# Patient Record
Sex: Male | Born: 1960 | Race: White | Hispanic: No | Marital: Married | Smoking: Never smoker
Health system: Southern US, Community
[De-identification: ages and names within clinical notes are randomized; demographics above are authoritative.]

## PROBLEM LIST (undated history)

## (undated) DIAGNOSIS — R519 Headache, unspecified: Secondary | ICD-10-CM

## (undated) DIAGNOSIS — R51 Headache: Secondary | ICD-10-CM

## (undated) DIAGNOSIS — M1990 Unspecified osteoarthritis, unspecified site: Secondary | ICD-10-CM

## (undated) HISTORY — DX: Headache: R51

## (undated) HISTORY — PX: EYE SURGERY: SHX253

## (undated) HISTORY — DX: Headache, unspecified: R51.9

## (undated) HISTORY — DX: Unspecified osteoarthritis, unspecified site: M19.90

---

## 2001-12-31 HISTORY — PX: VASECTOMY: SHX75

## 2009-01-14 ENCOUNTER — Ambulatory Visit: Payer: Self-pay | Admitting: Internal Medicine

## 2010-09-20 ENCOUNTER — Ambulatory Visit: Payer: Self-pay | Admitting: Internal Medicine

## 2010-09-20 LAB — CONVERTED CEMR LAB
ALT: 24 units/L (ref 0–53)
Basophils Relative: 0.6 % (ref 0.0–3.0)
Bilirubin, Direct: 0.1 mg/dL (ref 0.0–0.3)
Chloride: 104 meq/L (ref 96–112)
Eosinophils Relative: 2.3 % (ref 0.0–5.0)
Glucose, Urine, Semiquant: NEGATIVE
HCT: 44.3 % (ref 39.0–52.0)
Lymphs Abs: 2.2 10*3/uL (ref 0.7–4.0)
MCV: 90 fL (ref 78.0–100.0)
Monocytes Absolute: 0.4 10*3/uL (ref 0.1–1.0)
PSA: 0.5 ng/mL (ref 0.10–4.00)
Potassium: 5.4 meq/L — ABNORMAL HIGH (ref 3.5–5.1)
Protein, U semiquant: NEGATIVE
RBC: 4.93 M/uL (ref 4.22–5.81)
Total CHOL/HDL Ratio: 6
Total Protein: 6.8 g/dL (ref 6.0–8.3)
Urobilinogen, UA: 0.2
VLDL: 33 mg/dL (ref 0.0–40.0)
WBC Urine, dipstick: NEGATIVE
WBC: 5.4 10*3/uL (ref 4.5–10.5)

## 2010-09-27 ENCOUNTER — Ambulatory Visit: Payer: Self-pay | Admitting: Internal Medicine

## 2011-01-30 NOTE — Assessment & Plan Note (Signed)
Summary: cpx/njr   Vital Signs:  Patient profile:   50 year old male Height:      65 inches Weight:      184 pounds BMI:     30.73 Temp:     98.7 degrees F oral BP sitting:   122 / 78  (left arm) Cuff size:   regular  Vitals Entered By: Duard Brady LPN (September 27, 2010 9:20 AM) CC: cpx- doing well Is Patient Diabetic? No Flu Vaccine Consent Questions     Do you have a history of severe allergic reactions to this vaccine? no    Any prior history of allergic reactions to egg and/or gelatin? no    Do you have a sensitivity to the preservative Thimersol? no    Do you have a past history of Guillan-Barre Syndrome? no    Do you currently have an acute febrile illness? no    Have you ever had a severe reaction to latex? no    Vaccine information given and explained to patient? yes    Are you currently pregnant? no    Lot Number:[id number]   Exp Date:06/30/2011   Site Given  Left Deltoid IM   CC:  cpx- doing well.  History of Present Illness: 50 year old patient who is seen today for a health maintenance examination  Allergies: 1)  ! Penicillin G Potassium (Penicillin G Potassium) 2)  ! Valium  Past History:  Past Medical History: Reviewed history from 01/14/2009 and no changes required. history of UTI 5 years ago  Past Surgical History: Reviewed history from 01/14/2009 and no changes required. Inguinal herniorrhaphy  84 Vasectomy  2000  Family History: Reviewed history from 01/14/2009 and no changes required. father age 30 in good health retired Production designer, theatre/television/film mother, age 47, in good health  One brother  two sister are well  Both grandfathers history of diabetes  Social History: Reviewed history from 01/14/2009 and no changes required. 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  Review of Systems  The patient denies anorexia, fever, weight loss, weight gain, vision loss, decreased hearing, hoarseness, chest pain,  syncope, dyspnea on exertion, peripheral edema, prolonged cough, headaches, hemoptysis, abdominal pain, melena, hematochezia, severe indigestion/heartburn, hematuria, incontinence, genital sores, muscle weakness, suspicious skin lesions, transient blindness, difficulty walking, depression, unusual weight change, abnormal bleeding, enlarged lymph nodes, angioedema, breast masses, and testicular masses.    Physical Exam  General:  mildly overweight.  Blood pressure 120/78 Head:  Normocephalic and atraumatic without obvious abnormalities. No apparent alopecia or balding. Eyes:  No corneal or conjunctival inflammation noted. EOMI. Perrla. Funduscopic exam benign, without hemorrhages, exudates or papilledema. Vision grossly normal. Ears:  External ear exam shows no significant lesions or deformities.  Otoscopic examination reveals clear canals, tympanic membranes are intact bilaterally without bulging, retraction, inflammation or discharge. Hearing is grossly normal bilaterally. Nose:  External nasal examination shows no deformity or inflammation. Nasal mucosa are pink and moist without lesions or exudates. Mouth:  Oral mucosa and oropharynx without lesions or exudates.  Teeth in good repair. Neck:  No deformities, masses, or tenderness noted. Chest Wall:  No deformities, masses, tenderness or gynecomastia noted. Breasts:  No masses or gynecomastia noted Lungs:  Normal respiratory effort, chest expands symmetrically. Lungs are clear to auscultation, no crackles or wheezes. Heart:  Normal rate and regular rhythm. S1 and S2 normal without gallop, murmur, click, rub or other extra sounds. Abdomen:  Bowel sounds positive,abdomen soft and non-tender without masses,  organomegaly or hernias noted. Rectal:  No external abnormalities noted. Normal sphincter tone. No rectal masses or tenderness. Genitalia:  Testes bilaterally descended without nodularity, tenderness or masses. No scrotal masses or lesions. No  penis lesions or urethral discharge. Prostate:  Prostate gland firm and smooth, no enlargement, nodularity, tenderness, mass, asymmetry or induration. Msk:  No deformity or scoliosis noted of thoracic or lumbar spine.   Pulses:  R and L carotid,radial,femoral,dorsalis pedis and posterior tibial pulses are full and equal bilaterally Extremities:  No clubbing, cyanosis, edema, or deformity noted with normal full range of motion of all joints.   Neurologic:  No cranial nerve deficits noted. Station and gait are normal. Plantar reflexes are down-going bilaterally. DTRs are symmetrical throughout. Sensory, motor and coordinative functions appear intact. Skin:  Intact without suspicious lesions or rashes Cervical Nodes:  No lymphadenopathy noted Axillary Nodes:  No palpable lymphadenopathy Inguinal Nodes:  No significant adenopathy Psych:  Cognition and judgment appear intact. Alert and cooperative with normal attention span and concentration. No apparent delusions, illusions, hallucinations   Impression & Recommendations:  Problem # 1:  PREVENTIVE HEALTH CARE (ICD-V70.0)  Orders: Gastroenterology Referral (GI)  Problem # 2:  PREVENTIVE HEALTH CARE (ICD-V70.0)  Orders: Gastroenterology Referral (GI)  Complete Medication List: 1)  No Current Rx Meds   Other Orders: Admin 1st Vaccine (40981) Flu Vaccine 57yrs + ([address])  Patient Instructions: 1)  Please schedule a follow-up appointment in 1 year. 2)  It is important that you exercise regularly at least 20 minutes 5 times a week. If you develop chest pain, have severe difficulty breathing, or feel very tired , stop exercising immediately and seek medical attention. 3)  Schedule a colonoscopy/sigmoidoscopy to help detect colon cancer. 4)  Limit your Sodium (Salt) to less than 2 grams a day(slightly less than 1/2 a teaspoon) to prevent fluid retention, swelling, or worsening of symptoms.

## 2011-01-31 ENCOUNTER — Encounter (INDEPENDENT_AMBULATORY_CARE_PROVIDER_SITE_OTHER): Payer: Self-pay | Admitting: *Deleted

## 2011-02-07 NOTE — Letter (Signed)
Summary: Pre Visit Letter Revised  [facility]  [address] [place], [place] [address]   Phone: [phone]  Fax: [phone]        01/31/2011 MRN: [id number] Jacob Caldwell [address] [place], [place]  [address]             Procedure Date:  03/06/2011 @ 9:30   Direct colon-Dr. Leone Payor   Welcome to the Gastroenterology Division at [facility].    You are scheduled to see a nurse for your pre-procedure visit on 02/21/2011 at 11:00 on the 3rd floor at [facility], [address]  We ask that you try to arrive at our office 15 minutes prior to your appointment time to allow for check-in.  Please take a minute to review the attached form.  If you answer "Yes" to one or more of the questions on the first page, we ask that you call the person listed at your earliest opportunity.  If you answer "No" to all of the questions, please complete the rest of the form and bring it to your appointment.    Your nurse visit will consist of discussing your medical and surgical history, your immediate family medical history, and your medications.   If you are unable to list all of your medications on the form, please bring the medication bottles to your appointment and we will list them.  We will need to be aware of both prescribed and over the counter drugs.  We will need to know exact dosage information as well.    Please be prepared to read and sign documents such as consent forms, a financial agreement, and acknowledgement forms.  If necessary, and with your consent, a friend or relative is welcome to sit-in on the nurse visit with you.  Please bring your insurance card so that we may make a copy of it.  If your insurance requires a referral to see a specialist, please bring your referral form from your primary care physician.  No co-pay is required for this nurse visit.     If you cannot keep your appointment, please call [phone] to cancel or reschedule prior to  your appointment date.  This allows [place] the opportunity to schedule an appointment for another patient in need of care.    Thank you for choosing [facility] for your medical needs.  We appreciate the opportunity to care for you.  Please visit [place] at our website  to learn more about our practice.  Sincerely, The Gastroenterology Division

## 2011-02-20 ENCOUNTER — Encounter (INDEPENDENT_AMBULATORY_CARE_PROVIDER_SITE_OTHER): Payer: Self-pay | Admitting: *Deleted

## 2011-02-21 ENCOUNTER — Encounter: Payer: Self-pay | Admitting: Internal Medicine

## 2011-02-27 NOTE — Letter (Signed)
Summary: [facility] Instructions  [facility]  [address] [place], [place] [address]   Phone: [phone]  Fax: [phone]       Jacob Caldwell    [birth year]    MRN: [id number]        Procedure Day Dorna Bloom:  [name]  03/09/11     Arrival Time: 7:30AM      Procedure Time:  8:00AM     Location of Procedure:                    _ X_  [facility] (4th Floor)                      PREPARATION FOR COLONOSCOPY WITH MOVIPREP   Starting 5 days prior to your procedure 03/04/11 do not eat nuts, seeds, popcorn, corn, beans, peas,  salads, or any raw vegetables.  Do not take any fiber supplements (e.g. Metamucil, Citrucel, and Benefiber).  THE DAY BEFORE YOUR PROCEDURE         DATE: 03/08/11   DAY: THURSDAY  1.  Drink clear liquids the entire day-NO SOLID FOOD  2.  Do not drink anything colored red or purple.  Avoid juices with pulp.  No orange juice.  3.  Drink at least 64 oz. (8 glasses) of fluid/clear liquids during the day to prevent dehydration and help the prep work efficiently.  CLEAR LIQUIDS INCLUDE: Water Jello Ice Popsicles Tea (sugar ok, no milk/cream) Powdered fruit flavored drinks Coffee (sugar ok, no milk/cream) Gatorade Juice: apple, white grape, white cranberry  Lemonade Clear bullion, consomm, broth Carbonated beverages (any kind) Strained chicken noodle soup Hard Candy                             4.  In the morning, mix first dose of MoviPrep solution:    Empty 1 Pouch A and 1 Pouch B into the disposable container    Add lukewarm drinking water to the top line of the container. Mix to dissolve    Refrigerate (mixed solution should be used within 24 hrs)  5.  Begin drinking the prep at 5:00 p.m. The MoviPrep container is divided by 4 marks.   Every 15 minutes drink the solution down to the next mark (approximately 8 oz) until the full liter is complete.   6.  Follow completed prep with 16 oz of clear liquid of your choice (Nothing  red or purple).  Continue to drink clear liquids until bedtime.  7.  Before going to bed, mix second dose of MoviPrep solution:    Empty 1 Pouch A and 1 Pouch B into the disposable container    Add lukewarm drinking water to the top line of the container. Mix to dissolve    Refrigerate  THE DAY OF YOUR PROCEDURE      DATE: 03/09/11   DAY: FRIDAY  Beginning at 3:00AM (5 hours before procedure):         1. Every 15 minutes, drink the solution down to the next mark (approx 8 oz) until the full liter is complete.  2. Follow completed prep with 16 oz. of clear liquid of your choice.    3. You may drink clear liquids until 6:00AM (2 HOURS BEFORE PROCEDURE).   MEDICATION INSTRUCTIONS  Unless otherwise instructed, you should take regular prescription medications with a small sip of water   as early as possible the morning  of your procedure.        OTHER INSTRUCTIONS  You will need a responsible adult at least 51 years of age to accompany you and drive you home.   This person must remain in the waiting room during your procedure.  Wear loose fitting clothing that is easily removed.  Leave jewelry and other valuables at home.  However, you may wish to bring a book to read or  an iPod/MP3 player to listen to music as you wait for your procedure to start.  Remove all body piercing jewelry and leave at home.  Total time from sign-in until discharge is approximately 2-3 hours.  You should go home directly after your procedure and rest.  You can resume normal activities the  day after your procedure.  The day of your procedure you should not:   Drive   Make legal decisions   Operate machinery   Drink alcohol   Return to work  You will receive specific instructions about eating, activities and medications before you leave.    The above instructions have been reviewed and explained to me by   Wyona Almas RN  February 21, 2011 11:40 AM     I fully understand and can  verbalize these instructions _____________________________ Date _________

## 2011-02-27 NOTE — Miscellaneous (Signed)
Summary: [facility] Previsit/prep  Clinical Lists Changes  Medications: Added new medication of MOVIPREP 100 GM  SOLR (PEG-KCL-NACL-NASULF-NA ASC-C) As per prep instructions. - Signed Rx of MOVIPREP 100 GM  SOLR (PEG-KCL-NACL-NASULF-NA ASC-C) As per prep instructions.;  #1 x 0;  Signed;  Entered by: Wyona Almas RN;  Authorized by: Iva Boop MD, [facility];  Method used: Electronically to [facility]*, [address] [place], [place]  [id number], Ph: [phone], Fax: [phone] Allergies: Changed allergy or adverse reaction from PENICILLIN G POTASSIUM (PENICILLIN G POTASSIUM) to PENICILLIN G POTASSIUM (PENICILLIN G POTASSIUM)    Prescriptions: MOVIPREP 100 GM  SOLR (PEG-KCL-NACL-NASULF-NA ASC-C) As per prep instructions.  #1 x 0   Entered by:   Wyona Almas RN   Authorized by:   Iva Boop MD, [facility]   Signed by:   Wyona Almas RN on 02/21/2011   Method used:   Electronically to        [facility]* (retail)       [address]       [place], [place]  [id number]       Ph: [phone]       Fax: [phone]   RxID:   [id number]   Appended Document: [facility] Previsit/prep Correct provider is Yancey Flemings  Appended Document: [facility] Previsit/prep [facility] pharmacy notified of change in provider

## 2011-03-06 ENCOUNTER — Other Ambulatory Visit: Payer: Self-pay | Admitting: Internal Medicine

## 2011-03-09 ENCOUNTER — Encounter (AMBULATORY_SURGERY_CENTER): Payer: BC Managed Care – PPO | Admitting: Internal Medicine

## 2011-03-09 ENCOUNTER — Encounter: Payer: Self-pay | Admitting: Internal Medicine

## 2011-03-09 ENCOUNTER — Other Ambulatory Visit: Payer: Self-pay | Admitting: Internal Medicine

## 2011-03-09 DIAGNOSIS — Z1211 Encounter for screening for malignant neoplasm of colon: Secondary | ICD-10-CM

## 2011-03-09 DIAGNOSIS — D126 Benign neoplasm of colon, unspecified: Secondary | ICD-10-CM

## 2011-03-09 DIAGNOSIS — K5730 Diverticulosis of large intestine without perforation or abscess without bleeding: Secondary | ICD-10-CM

## 2011-03-13 NOTE — Procedures (Addendum)
Summary: Colonoscopy  Patient: [name] Note: All result statuses are Final unless otherwise noted.  Tests: (1) Colonoscopy (COL)   COL Colonoscopy           DONE     [facility]     [address]     [place], [place]  [address]          COLONOSCOPY PROCEDURE REPORT          PATIENT:  [name], [name]  MR#:  [id number]     BIRTHDATE:  1961/12/03, 49 yrs. old  GENDER:  male     ENDOSCOPIST:  Wilhemina Bonito. Eda Keys, MD     REF. BY:  Eleonore Chiquito, M.D.     PROCEDURE DATE:  03/09/2011     PROCEDURE:  Colonoscopy with snare polypectomy x 2     ASA CLASS:  Class I     INDICATIONS:  Routine Risk Screening     MEDICATIONS:   MAC sedation, administered by CRNA, propofol     (Diprivan) 360 mg IV          DESCRIPTION OF PROCEDURE:   After the risks benefits and     alternatives of the procedure were thoroughly explained, informed     consent was obtained.  Digital rectal exam was performed and     revealed no abnormalities.   The LB 180AL [id number] endoscope was     introduced through the anus and advanced to the cecum, which was     identified by both the appendix and ileocecal valve, without     limitations.Time to cecum =3:00 min.  The quality of the prep was     excellent, using MoviPrep.  The instrument was then slowly     withdrawn (time = 9:39 min) as the colon was fully examined.     <<PROCEDUREIMAGES>>          FINDINGS:  Two 3mm polyps were found in the ascending colon and     transverse colon. Polyps were snared without cautery. Retrieval     was successful.   Moderate diverticulosis was found in the left     colon.  Otherwise normal colonoscopy without other polyps, masses,     vascular ectasias, or inflammatory changes.   Retroflexed views in     the rectum revealed no abnormalities.    The scope was then     withdrawn from the patient and the procedure completed.          COMPLICATIONS:  None     ENDOSCOPIC IMPRESSION:     1) Two polyps - removed  2) Moderate diverticulosis in the left colon     3) Otherwise nl colonoscopy          RECOMMENDATIONS:     1) Repeat colonoscopy in 5 years if polyp adenomatous; otherwise     10 years          ______________________________     Wilhemina Bonito. Eda Keys, MD          CC:  Gordy Savers, MD; The Patient          n.     eSIGNED:   Wilhemina Bonito. Eda Keys at 03/09/2011 08:58 AM          Moody Bruins, [phone]  Note: An exclamation mark (!) indicates a result that was not dispersed into the flowsheet. Document Creation Date: 03/09/2011 8:58 AM _______________________________________________________________________  (1) Order result status: Final Collection or observation date-time:  03/09/2011 08:51 Requested date-time:  Receipt date-time:  Reported date-time:  Referring Physician:   Ordering Physician: Fransico Setters [phone]) Specimen Source:  Source: Launa Grill Order Number: [id number] Lab site:   Appended Document: Colonoscopy     Procedures Next Due Date:    Colonoscopy: 02/2016

## 2011-03-17 ENCOUNTER — Encounter: Payer: Self-pay | Admitting: Internal Medicine

## 2011-03-20 NOTE — Letter (Addendum)
Summary: Patient Notice- Polyp Results  [facility]  [address] [place], [place] [address]   Phone: [phone]  Fax: [phone]        March 17, 2011 MRN: [id number]    Jacob Caldwell [address] [place], [place]  [address]    Dear [name],  I am pleased to inform you that the colon polyp(s) removed during your recent colonoscopy was (were) found to be benign (no cancer detected) upon pathologic examination.  I recommend you have a repeat colonoscopy examination in 5 years to look for recurrent polyps, as having colon polyps increases your risk for having recurrent polyps or even colon cancer in the future.  Should you develop new or worsening symptoms of abdominal pain, bowel habit changes or bleeding from the rectum or bowels, please schedule an evaluation with either your primary care physician or with me.  Additional information/recommendations:  __ No further action with gastroenterology is needed at this time. Please      follow-up with your primary care physician for your other healthcare      needs.   Please call us if you are having persistent problems or have questions about your condition that have not been fully answered at this time.  Sincerely,  Hilarie Fredrickson MD  This letter has been electronically signed by your physician.  Appended Document: Patient Notice- Polyp Results letter mailed

## 2016-01-13 ENCOUNTER — Ambulatory Visit (INDEPENDENT_AMBULATORY_CARE_PROVIDER_SITE_OTHER): Payer: BC Managed Care – PPO

## 2016-01-13 ENCOUNTER — Ambulatory Visit: Payer: BC Managed Care – PPO | Admitting: Family Medicine

## 2016-01-13 ENCOUNTER — Ambulatory Visit (INDEPENDENT_AMBULATORY_CARE_PROVIDER_SITE_OTHER): Payer: BC Managed Care – PPO | Admitting: Physician Assistant

## 2016-01-13 VITALS — BP 114/80 | HR 70 | Temp 98.9°F | Resp 18 | Ht 66.0 in | Wt 182.4 lb

## 2016-01-13 DIAGNOSIS — J189 Pneumonia, unspecified organism: Secondary | ICD-10-CM

## 2016-01-13 DIAGNOSIS — R509 Fever, unspecified: Secondary | ICD-10-CM | POA: Diagnosis not present

## 2016-01-13 DIAGNOSIS — R059 Cough, unspecified: Secondary | ICD-10-CM

## 2016-01-13 DIAGNOSIS — R05 Cough: Secondary | ICD-10-CM | POA: Diagnosis not present

## 2016-01-13 DIAGNOSIS — J181 Lobar pneumonia, unspecified organism: Secondary | ICD-10-CM

## 2016-01-13 LAB — POCT CBC
Granulocyte percent: 78.1 %G (ref 37–80)
HCT, POC: 39.2 % — AB (ref 43.5–53.7)
HEMOGLOBIN: 13.2 g/dL — AB (ref 14.1–18.1)
LYMPH, POC: 2.5 (ref 0.6–3.4)
MCH, POC: 28.3 pg (ref 27–31.2)
MCHC: 33.7 g/dL (ref 31.8–35.4)
MCV: 83.9 fL (ref 80–97)
MID (cbc): 0.5 (ref 0–0.9)
MPV: 5.8 fL (ref 0–99.8)
PLATELET COUNT, POC: 544 10*3/uL — AB (ref 142–424)
POC GRANULOCYTE: 10.7 — AB (ref 2–6.9)
POC LYMPH %: 18.5 % (ref 10–50)
POC MID %: 3.4 %M (ref 0–12)
RBC: 4.68 M/uL — AB (ref 4.69–6.13)
RDW, POC: 13.3 %
WBC: 13.7 10*3/uL — AB (ref 4.6–10.2)

## 2016-01-13 MED ORDER — LEVOFLOXACIN 500 MG PO TABS
500.0000 mg | ORAL_TABLET | Freq: Every day | ORAL | Status: DC
Start: 2016-01-13 — End: 2016-03-25

## 2016-01-13 MED ORDER — GUAIFENESIN ER 1200 MG PO TB12: 1.0000 | ORAL_TABLET | Freq: Two times a day (BID) | ORAL | Status: AC

## 2016-01-13 MED ORDER — HYDROCOD POLST-CPM POLST ER 10-8 MG/5ML PO SUER: 5.0000 mL | Freq: Two times a day (BID) | ORAL | Status: DC | PRN

## 2016-01-13 NOTE — Progress Notes (Signed)
Jacob Caldwell  MRN: [id number] DOB: 1961-05-23  Subjective:  Pt presents to clinic with concerns about a 3 week fever and then over the last week he has developed a cough that is productive more at night without SOB or wheezing. He is having no nasal congestion and no PND.  No abd or urinary symptoms.  He slightly SOB with activity and has decrease energy and appetite over the last week or so.  Home treatment - motrin for fever and nyquil for cough - does not seem to be helping that much  2 weeks ago seen at the [facility] - neg flu was told viral and to wait it out  There are no active problems to display for this patient.   No current outpatient prescriptions on file prior to visit.   No current facility-administered medications on file prior to visit.    Allergies  Allergen Reactions  . Diazepam     REACTION: hyperactive  . Penicillins     REACTION: hives    Review of Systems  Constitutional: Positive for fever (100-101 in the am - highest 102), chills and appetite change (decreased).  HENT: Negative for congestion, postnasal drip and rhinorrhea.   Respiratory: Positive for cough (clear with minimal yellow). Negative for shortness of breath and wheezing.        No h/o asthma or lung problems, nonsmoker  Gastrointestinal: Negative.   Psychiatric/Behavioral: Positive for sleep disturbance (cough).   Objective:  BP 114/80 mmHg  Pulse 70  Temp(Src) 98.9 F (37.2 C) (Oral)  Resp 18  Ht 5\' 6"  (1.676 m)  Wt 182 lb 6.4 oz (82.736 kg)  BMI 29.45 kg/m2  SpO2 97%  Physical Exam  Constitutional: He is oriented to person, place, and time and well-developed, well-nourished, and in no distress.     HENT:  Head: Normocephalic and atraumatic.  Right Ear: Hearing, tympanic membrane, external ear and ear canal normal.  Left Ear: Hearing, tympanic membrane, external ear and ear canal normal.  Nose: Nose normal.  Mouth/Throat: Uvula is midline, oropharynx is clear and  moist and mucous membranes are normal.  Eyes: Conjunctivae are normal.  Neck: Normal range of motion.  Cardiovascular: Normal rate, regular rhythm and normal heart sounds.   Pulmonary/Chest: Effort normal. He has wheezes (end expiratory worse on the left and worse at bases - slights clears after cough).  Lymphadenopathy:       Head (right side): No tonsillar adenopathy present.       Head (left side): No tonsillar adenopathy present.    He has no cervical adenopathy.       Right: No supraclavicular adenopathy present.       Left: No supraclavicular adenopathy present.  Neurological: He is alert and oriented to person, place, and time. Gait normal.  Skin: Skin is warm and dry.  Psychiatric: Mood, memory, affect and judgment normal.   [facility] reading (PRIMARY) by  Dr. Lorelei Pont.  RML PNA  Results for orders placed or performed in visit on 01/13/16  POCT CBC  Result Value Ref Range   WBC 13.7 (A) 4.6 - 10.2 K/uL   Lymph, poc 2.5 0.6 - 3.4   POC LYMPH PERCENT 18.5 10 - 50 %L   MID (cbc) 0.5 0 - 0.9   POC MID % 3.4 0 - 12 %M   POC Granulocyte 10.7 (A) 2 - 6.9   Granulocyte percent 78.1 37 - 80 %G   RBC 4.68 (A) 4.69 - 6.13 M/uL  Hemoglobin 13.2 (A) 14.1 - 18.1 g/dL   HCT, POC 39.2 (A) 43.5 - 53.7 %   MCV 83.9 80 - 97 fL   MCH, POC 28.3 27 - 31.2 pg   MCHC 33.7 31.8 - 35.4 g/dL   RDW, POC 13.3 %   Platelet Count, POC 544 (A) 142 - 424 K/uL   MPV 5.8 0 - 99.8 fL    Assessment and Plan :  Cough - Plan: DG Chest 2 View, Care order/instruction  Fever, unspecified - Plan: POCT CBC  RML pneumonia - Plan: Guaifenesin (MUCINEX MAXIMUM STRENGTH) 1200 MG TB12, chlorpheniramine-HYDROcodone (TUSSIONEX PENNKINETIC ER) 10-8 MG/5ML SUER, levofloxacin (LEVAQUIN) 500 MG tablet   Pt has community acquired PNA and we will treat accordingly.  He will hydrate and recheck if he has further problems.  D/w Dr Lorelei Pont  Windell Hummingbird PA-C  [facility] 01/14/2016 9:53 AM

## 2016-01-15 ENCOUNTER — Encounter: Payer: Self-pay | Admitting: Family Medicine

## 2016-03-16 ENCOUNTER — Encounter: Payer: Self-pay | Admitting: Internal Medicine

## 2016-03-25 ENCOUNTER — Ambulatory Visit (INDEPENDENT_AMBULATORY_CARE_PROVIDER_SITE_OTHER): Payer: BC Managed Care – PPO | Admitting: Physician Assistant

## 2016-03-25 DIAGNOSIS — J069 Acute upper respiratory infection, unspecified: Secondary | ICD-10-CM

## 2016-03-25 DIAGNOSIS — B9789 Other viral agents as the cause of diseases classified elsewhere: Principal | ICD-10-CM

## 2016-03-25 MED ORDER — HYDROCOD POLST-CPM POLST ER 10-8 MG/5ML PO SUER: 5.0000 mL | Freq: Two times a day (BID) | ORAL | Status: DC | PRN

## 2016-03-25 NOTE — Patient Instructions (Signed)
     IF you received an x-ray today, you will receive an invoice from [facility]. Please contact [facility] at [phone] with questions or concerns regarding your invoice.   IF you received labwork today, you will receive an invoice from [facility]. Please contact [facility] at [phone] with questions or concerns regarding your invoice.   Our billing staff will not be able to assist you with questions regarding bills from these companies.  You will be contacted with the lab results as soon as they are available. The fastest way to get your results is to activate your My Chart account. Instructions are located on the last page of this paperwork. If you have not heard from us regarding the results in 2 weeks, please contact this office.      

## 2016-03-25 NOTE — Progress Notes (Signed)
   Jacob Caldwell  MRN: [id number] DOB: [birth year]  Subjective:  Pt presents to clinic with low grade fever and productive cough for the last 5 days.  He has no nasal symptoms.  He is coughing up clear sputum.  He feels like he did 2 months ago when he was treated for pneumonia and he wants to makes sure he does not have this at this time.  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   No SOB or wheezing.  Home treatment - Advil and mucinex  There are no active problems to display for this patient.   No current outpatient prescriptions on file prior to visit.   No current facility-administered medications on file prior to visit.    Allergies  Allergen Reactions  . Diazepam     REACTION: hyperactive  . Penicillins     REACTION: hives    Review of Systems  Constitutional: Positive for fever (subjective). Negative for chills.  HENT: Negative for congestion.   Respiratory: Positive for cough. Negative for shortness of breath and wheezing.        No h/o asthma, nonsmoker  Musculoskeletal: Negative for myalgias.  Neurological: Negative for headaches.   Objective:  BP 118/64 mmHg  Pulse 84  Temp(Src) 99.6 F (37.6 C) (Oral)  Resp 18  Ht 5\' 6"  (1.676 m)  Wt 182 lb (82.555 kg)  BMI 29.39 kg/m2  SpO2 98%  Physical Exam  Constitutional: He is oriented to person, place, and time and well-developed, well-nourished, and in no distress.  HENT:  Head: Normocephalic and atraumatic.  Right Ear: Hearing, tympanic membrane, external ear and ear canal normal.  Left Ear: Hearing, tympanic membrane, external ear and ear canal normal.  Nose: Nose normal.  Mouth/Throat: Uvula is midline, oropharynx is clear and moist and mucous membranes are normal.  Eyes: Conjunctivae are normal.  Neck: Normal range of motion.  Cardiovascular: Normal rate, regular rhythm and normal heart sounds.   Pulmonary/Chest: Effort normal and breath sounds normal. He has no wheezes (no wheezing  with forced expiration).  Deep coughs from his chest  Lymphadenopathy:       Head (right side): No tonsillar adenopathy present.       Head (left side): No tonsillar adenopathy present.    He has no cervical adenopathy.       Right: No supraclavicular adenopathy present.       Left: No supraclavicular adenopathy present.  Neurological: He is alert and oriented to person, place, and time. Gait normal.  Skin: Skin is warm and dry.  Psychiatric: Mood, memory, affect and judgment normal.    Assessment and Plan :  Viral URI with cough - Plan: chlorpheniramine-HYDROcodone (TUSSIONEX PENNKINETIC ER) 10-8 MG/5ML SUER, Care order/instruction   At this time lungs are clear and we will treat symptoms.  He will continue Mucinex at home and add cough medications as needed.  He will stay hydrated.  He will contact me if he is having problems in about 4-5 days to determine if an abx if needed at that time.  Windell Hummingbird PA-C  [facility] 03/25/2016 9:35 AM

## 2016-03-29 ENCOUNTER — Telehealth: Payer: Self-pay | Admitting: Internal Medicine

## 2016-03-29 NOTE — Telephone Encounter (Signed)
Pt would like to re-est with dr Raliegh Ip. Can I sch?

## 2016-03-29 NOTE — Telephone Encounter (Signed)
Okay to schedule

## 2016-03-30 NOTE — Telephone Encounter (Signed)
Okay 

## 2016-04-03 NOTE — Telephone Encounter (Signed)
Pt will callback once he has his calendar

## 2016-07-10 ENCOUNTER — Ambulatory Visit (INDEPENDENT_AMBULATORY_CARE_PROVIDER_SITE_OTHER): Payer: BC Managed Care – PPO | Admitting: Internal Medicine

## 2016-07-10 ENCOUNTER — Encounter: Payer: Self-pay | Admitting: Internal Medicine

## 2016-07-10 VITALS — BP 150/90 | HR 67 | Temp 98.5°F | Resp 20 | Ht 64.5 in | Wt 180.0 lb

## 2016-07-10 DIAGNOSIS — Z86010 Personal history of colonic polyps: Secondary | ICD-10-CM | POA: Diagnosis not present

## 2016-07-10 DIAGNOSIS — Z0000 Encounter for general adult medical examination without abnormal findings: Secondary | ICD-10-CM

## 2016-07-10 DIAGNOSIS — Z23 Encounter for immunization: Secondary | ICD-10-CM

## 2016-07-10 DIAGNOSIS — R7989 Other specified abnormal findings of blood chemistry: Secondary | ICD-10-CM | POA: Diagnosis not present

## 2016-07-10 LAB — POCT URINALYSIS DIPSTICK
Bilirubin, UA: NEGATIVE
Glucose, UA: NEGATIVE
Ketones, UA: NEGATIVE
Leukocytes, UA: NEGATIVE
NITRITE UA: NEGATIVE
PROTEIN UA: NEGATIVE
RBC UA: NEGATIVE
Urobilinogen, UA: 0.2
pH, UA: 6

## 2016-07-10 NOTE — Progress Notes (Signed)
   Subjective:    Patient ID: Jacob Caldwell, male    DOB: Mar 03, 1961, 55 y.o.   MRN: [id number]  HPI    Review of Systems     Objective:   Physical Exam        Assessment & Plan:

## 2016-07-10 NOTE — Progress Notes (Signed)
Subjective:    Patient ID: Jacob Caldwell, male    DOB: [birth year], 55 y.o.   MRN: [id number]  HPI  History of Present Illness: 55 -year-old gentleman seen today to establish  with our practice AND  for an annual exam. He has enjoyed excellent health without concerns or complaints. He exercises regularly   Current Allergies: ! 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   Risk Factors: Regular exercises.  Plays tennis 3-4 times per week and goes to the local gym at least once weekly.  Walks throughout the week.  Also with his wife  Tobacco use: never    Review of Systems  Constitutional: Negative for fever, chills, activity change, appetite change and fatigue.  HENT: Negative for congestion, dental problem, ear pain, hearing loss, mouth sores, rhinorrhea, sinus pressure, sneezing, tinnitus, trouble swallowing and voice change.   Eyes: Negative for photophobia, pain, redness and visual disturbance.  Respiratory: Negative for apnea, cough, choking, chest tightness, shortness of breath and wheezing.   Cardiovascular: Negative for chest pain, palpitations and leg swelling.  Gastrointestinal: Negative for nausea, vomiting, abdominal pain, diarrhea, constipation, blood in stool, abdominal distention, anal bleeding and rectal pain.  Genitourinary: Negative for dysuria, urgency, frequency, hematuria, flank  pain, decreased urine volume, discharge, penile swelling, scrotal swelling, difficulty urinating, genital sores and testicular pain.  Musculoskeletal: Negative for myalgias, back pain, joint swelling, arthralgias, gait problem, neck pain and neck stiffness.  Skin: Negative for color change, rash and wound.  Neurological: Negative for dizziness, tremors, seizures, syncope, facial asymmetry, speech difficulty, weakness, light-headedness, numbness and headaches.  Hematological: Negative for adenopathy. Does not bruise/bleed easily.  Psychiatric/Behavioral: Negative for suicidal ideas, hallucinations, behavioral problems, confusion, sleep disturbance, self-injury, dysphoric mood, decreased concentration and agitation. The patient is not nervous/anxious.        Objective:   Physical Exam  Constitutional: He appears well-developed and well-nourished.  HENT:  Head: Normocephalic and atraumatic.  Right Ear: External ear normal.  Left Ear: External ear normal.  Nose: Nose normal.  Mouth/Throat: Oropharynx is clear and moist.  Eyes: Conjunctivae and EOM are normal. Pupils are equal, round, and reactive to light. No scleral icterus.  Neck: Normal range of motion. Neck supple. No JVD present. No thyromegaly present.  Cardiovascular: Regular rhythm, normal heart sounds and intact distal pulses.  Exam reveals no gallop and no friction rub.   No murmur heard. Pulmonary/Chest: Effort normal and breath sounds normal. He exhibits no tenderness.  Abdominal: Soft. Bowel sounds are normal. He exhibits no distension and no mass. There is no tenderness.  Genitourinary: Prostate normal and penis normal. Guaiac negative stool.  Musculoskeletal: Normal range of motion. He exhibits no edema or tenderness.  Lymphadenopathy:    He has no cervical adenopathy.  Neurological: He is alert. He has normal reflexes. No cranial nerve deficit. Coordination normal.  Skin: Skin is warm and dry. No rash noted.  Psychiatric: He  has a normal mood  and affect. His behavior is normal.          Assessment & Plan:   Preventive health care History of colonic polyps.  Patient has received a recall letter for follow-up colonoscopy, which she will obtain in the fall Blood pressure today is borderline high.  He was asked to monitor blood pressure readings more carefully.  Otherwise, return in one year for follow-up Will review.  Screening lab  Nyoka Cowden, MD

## 2016-07-10 NOTE — Progress Notes (Signed)
Pre visit review using our clinic review tool, if applicable. No additional management support is needed unless otherwise documented below in the visit note. 

## 2016-07-10 NOTE — Patient Instructions (Addendum)
Schedule your colonoscopy to help detect colon cancer.    It is important that you exercise regularly, at least 20 minutes 3 to 4 times per week.  If you develop chest pain or shortness of breath seek  medical attention.    Health Maintenance, Male A healthy lifestyle and preventative care can promote health and wellness.  Maintain regular health, dental, and eye exams.  Eat a healthy diet. Foods like vegetables, fruits, whole grains, low-fat dairy products, and lean protein foods contain the nutrients you need and are low in calories. Decrease your intake of foods high in solid fats, added sugars, and salt. Get information about a proper diet from your health care provider, if necessary.  Regular physical exercise is one of the most important things you can do for your health. Most adults should get at least 150 minutes of moderate-intensity exercise (any activity that increases your heart rate and causes you to sweat) each week. In addition, most adults need muscle-strengthening exercises on 2 or more days a week.   Maintain a healthy weight. The body mass index (BMI) is a screening tool to identify possible weight problems. It provides an estimate of body fat based on height and weight. Your health care provider can find your BMI and can help you achieve or maintain a healthy weight. For males 20 years and older:  A BMI below 18.5 is considered underweight.  A BMI of 18.5 to 24.9 is normal.  A BMI of 25 to 29.9 is considered overweight.  A BMI of 30 and above is considered obese.  Maintain normal blood lipids and cholesterol by exercising and minimizing your intake of saturated fat. Eat a balanced diet with plenty of fruits and vegetables. Blood tests for lipids and cholesterol should begin at age 64 and be repeated every 5 years. If your lipid or cholesterol levels are high, you are over age 71, or you are at high risk for heart disease, you may need your cholesterol levels checked more  frequently.Ongoing high lipid and cholesterol levels should be treated with medicines if diet and exercise are not working.  If you smoke, find out from your health care provider how to quit. If you do not use tobacco, do not start.  Lung cancer screening is recommended for adults aged 38-80 years who are at high risk for developing lung cancer because of a history of smoking. A yearly low-dose CT scan of the lungs is recommended for people who have at least a 30-pack-year history of smoking and are current smokers or have quit within the past 15 years. A pack year of smoking is smoking an average of 1 pack of cigarettes a day for 1 year (for example, a 30-pack-year history of smoking could mean smoking 1 pack a day for 30 years or 2 packs a day for 15 years). Yearly screening should continue until the smoker has stopped smoking for at least 15 years. Yearly screening should be stopped for people who develop a health problem that would prevent them from having lung cancer treatment.  If you choose to drink alcohol, do not have more than 2 drinks per day. One drink is considered to be 12 oz (360 mL) of beer, 5 oz (150 mL) of wine, or 1.5 oz (45 mL) of liquor.  Avoid the use of street drugs. Do not share needles with anyone. Ask for help if you need support or instructions about stopping the use of drugs.  High blood pressure causes heart disease  and increases the risk of stroke. High blood pressure is more likely to develop in:  People who have blood pressure in the end of the normal range (100-139/85-89 mm Hg).  People who are overweight or obese.  People who are African American.  If you are 58-49 years of age, have your blood pressure checked every 3-5 years. If you are 7 years of age or older, have your blood pressure checked every year. You should have your blood pressure measured twice--once when you are at a hospital or clinic, and once when you are not at a hospital or clinic. Record the  average of the two measurements. To check your blood pressure when you are not at a hospital or clinic, you can use:  An automated blood pressure machine at a pharmacy.  A home blood pressure monitor.  If you are 110-98 years old, ask your health care provider if you should take aspirin to prevent heart disease.  Diabetes screening involves taking a blood sample to check your fasting blood sugar level. This should be done once every 3 years after age 24 if you are at a normal weight and without risk factors for diabetes. Testing should be considered at a younger age or be carried out more frequently if you are overweight and have at least 1 risk factor for diabetes.  Colorectal cancer can be detected and often prevented. Most routine colorectal cancer screening begins at the age of 109 and continues through age 92. However, your health care provider may recommend screening at an earlier age if you have risk factors for colon cancer. On a yearly basis, your health care provider may provide home test kits to check for hidden blood in the stool. A small camera at the end of a tube may be used to directly examine the colon (sigmoidoscopy or colonoscopy) to detect the earliest forms of colorectal cancer. Talk to your health care provider about this at age 41 when routine screening begins. A direct exam of the colon should be repeated every 5-10 years through age 31, unless early forms of precancerous polyps or small growths are found.  People who are at an increased risk for hepatitis B should be screened for this virus. You are considered at high risk for hepatitis B if:  You were born in a country where hepatitis B occurs often. Talk with your health care provider about which countries are considered high risk.  Your parents were born in a high-risk country and you have not received a shot to protect against hepatitis B (hepatitis B vaccine).  You have HIV or AIDS.  You use needles to inject street  drugs.  You live with, or have sex with, someone who has hepatitis B.  You are a man who has sex with other men (MSM).  You get hemodialysis treatment.  You take certain medicines for conditions like cancer, organ transplantation, and autoimmune conditions.  Hepatitis C blood testing is recommended for all people born from [birth year] through 1967 and any individual with known risk factors for hepatitis C.  Healthy men should no longer receive prostate-specific antigen (PSA) blood tests as part of routine cancer screening. Talk to your health care provider about prostate cancer screening.  Testicular cancer screening is not recommended for adolescents or adult males who have no symptoms. Screening includes self-exam, a health care provider exam, and other screening tests. Consult with your health care provider about any symptoms you have or any concerns you have about testicular cancer.  Practice safe sex. Use condoms and avoid high-risk sexual practices to reduce the spread of sexually transmitted infections (STIs).  You should be screened for STIs, including gonorrhea and chlamydia if:  You are sexually active and are younger than 24 years.  You are older than 24 years, and your health care provider tells you that you are at risk for this type of infection.  Your sexual activity has changed since you were last screened, and you are at an increased risk for chlamydia or gonorrhea. Ask your health care provider if you are at risk.  If you are at risk of being infected with HIV, it is recommended that you take a prescription medicine daily to prevent HIV infection. This is called pre-exposure prophylaxis (PrEP). You are considered at risk if:  You are a man who has sex with other men (MSM).  You are a heterosexual man who is sexually active with multiple partners.  You take drugs by injection.  You are sexually active with a partner who has HIV.  Talk with your health care provider about  whether you are at high risk of being infected with HIV. If you choose to begin PrEP, you should first be tested for HIV. You should then be tested every 3 months for as long as you are taking PrEP.  Use sunscreen. Apply sunscreen liberally and repeatedly throughout the day. You should seek shade when your shadow is shorter than you. Protect yourself by wearing long sleeves, pants, a wide-brimmed hat, and sunglasses year round whenever you are outdoors.  Tell your health care provider of new moles or changes in moles, especially if there is a change in shape or color. Also, tell your health care provider if a mole is larger than the size of a pencil eraser.  A one-time screening for abdominal aortic aneurysm (AAA) and surgical repair of large AAAs by ultrasound is recommended for men aged 13-75 years who are current or former smokers.  Stay current with your vaccines (immunizations).   This information is not intended to replace advice given to you by your health care provider. Make sure you discuss any questions you have with your health care provider.   Document Released: 06/14/2008 Document Revised: 01/07/2015 Document Reviewed: 05/14/2011 Elsevier Interactive Patient Education [employer].

## 2016-07-11 LAB — CBC WITH DIFFERENTIAL/PLATELET
BASOS ABS: 0 10*3/uL (ref 0.0–0.1)
Basophils Relative: 0.5 % (ref 0.0–3.0)
EOS PCT: 3.1 % (ref 0.0–5.0)
Eosinophils Absolute: 0.2 10*3/uL (ref 0.0–0.7)
HCT: 42.5 % (ref 39.0–52.0)
HEMOGLOBIN: 14.5 g/dL (ref 13.0–17.0)
Lymphocytes Relative: 35.6 % (ref 12.0–46.0)
Lymphs Abs: 2.5 10*3/uL (ref 0.7–4.0)
MCHC: 34.1 g/dL (ref 30.0–36.0)
MCV: 85.8 fl (ref 78.0–100.0)
MONO ABS: 0.6 10*3/uL (ref 0.1–1.0)
Monocytes Relative: 9.1 % (ref 3.0–12.0)
Neutro Abs: 3.6 10*3/uL (ref 1.4–7.7)
Neutrophils Relative %: 51.7 % (ref 43.0–77.0)
Platelets: 247 10*3/uL (ref 150.0–400.0)
RBC: 4.96 Mil/uL (ref 4.22–5.81)
RDW: 13.7 % (ref 11.5–15.5)
WBC: 7 10*3/uL (ref 4.0–10.5)

## 2016-07-11 LAB — COMPREHENSIVE METABOLIC PANEL
ALBUMIN: 4.5 g/dL (ref 3.5–5.2)
ALK PHOS: 57 U/L (ref 39–117)
ALT: 12 U/L (ref 0–53)
AST: 15 U/L (ref 0–37)
BUN: 20 mg/dL (ref 6–23)
CO2: 29 mEq/L (ref 19–32)
Calcium: 10.2 mg/dL (ref 8.4–10.5)
Chloride: 103 mEq/L (ref 96–112)
Creatinine, Ser: 1.08 mg/dL (ref 0.40–1.50)
GFR: 74.93 mL/min (ref >60.00)
Glucose, Bld: 98 mg/dL (ref 70–99)
POTASSIUM: 4.5 meq/L (ref 3.5–5.1)
SODIUM: 140 meq/L (ref 135–145)
TOTAL PROTEIN: 7.3 g/dL (ref 6.0–8.3)
Total Bilirubin: 0.4 mg/dL (ref 0.2–1.2)

## 2016-07-11 LAB — TSH: TSH: 2.4 u[IU]/mL (ref 0.35–4.50)

## 2016-07-11 LAB — LIPID PANEL
Cholesterol: 222 mg/dL — ABNORMAL HIGH (ref 0–200)
HDL: 37.9 mg/dL — AB (ref >39.00)
NonHDL: 184.29
TRIGLYCERIDES: 218 mg/dL — AB (ref 0.0–149.0)
Total CHOL/HDL Ratio: 6
VLDL: 43.6 mg/dL — ABNORMAL HIGH (ref 0.0–40.0)

## 2016-07-11 LAB — LDL CHOLESTEROL, DIRECT: LDL DIRECT: 157 mg/dL

## 2016-07-11 LAB — PSA: PSA: 1.39 ng/mL (ref 0.10–4.00)

## 2016-07-27 ENCOUNTER — Encounter: Payer: Self-pay | Admitting: Internal Medicine

## 2016-09-04 ENCOUNTER — Ambulatory Visit (AMBULATORY_SURGERY_CENTER): Payer: Self-pay

## 2016-09-04 VITALS — Ht 65.0 in | Wt 183.8 lb

## 2016-09-04 DIAGNOSIS — Z860100 Personal history of colon polyps, unspecified: Secondary | ICD-10-CM

## 2016-09-04 MED ORDER — SUPREP BOWEL PREP KIT 17.5-3.13-1.6 GM/177ML PO SOLN: 1.0000 | Freq: Once | ORAL | 0 refills | Status: AC

## 2016-09-05 ENCOUNTER — Encounter: Payer: Self-pay | Admitting: Internal Medicine

## 2016-09-17 ENCOUNTER — Encounter: Payer: Self-pay | Admitting: Internal Medicine

## 2016-09-17 ENCOUNTER — Ambulatory Visit (AMBULATORY_SURGERY_CENTER): Payer: BC Managed Care – PPO | Admitting: Internal Medicine

## 2016-09-17 VITALS — BP 116/67 | HR 58 | Temp 97.8°F | Resp 15 | Ht 65.0 in | Wt 183.0 lb

## 2016-09-17 DIAGNOSIS — Z86010 Personal history of colonic polyps: Secondary | ICD-10-CM | POA: Diagnosis not present

## 2016-09-17 MED ORDER — SODIUM CHLORIDE 0.9 % IV SOLN: 500.0000 mL | INTRAVENOUS | Status: DC

## 2016-09-17 NOTE — Progress Notes (Signed)
Report to PACU, RN, vss, BBS= Clear.  

## 2016-09-17 NOTE — Patient Instructions (Signed)
YOU HAD AN ENDOSCOPIC PROCEDURE TODAY AT [facility]:   Refer to the procedure report that was given to you for any specific questions about what was found during the examination.  If the procedure report does not answer your questions, please call your gastroenterologist to clarify.  If you requested that your care partner not be given the details of your procedure findings, then the procedure report has been included in a sealed envelope for you to review at your convenience later.  YOU SHOULD EXPECT: Some feelings of bloating in the abdomen. Passage of more gas than usual.  Walking can help get rid of the air that was put into your GI tract during the procedure and reduce the bloating. If you had a lower endoscopy (such as a colonoscopy or flexible sigmoidoscopy) you may notice spotting of blood in your stool or on the toilet paper. If you underwent a bowel prep for your procedure, you may not have a normal bowel movement for a few days.  Please Note:  You might notice some irritation and congestion in your nose or some drainage.  This is from the oxygen used during your procedure.  There is no need for concern and it should clear up in a day or so.  SYMPTOMS TO REPORT IMMEDIATELY:   Following lower endoscopy (colonoscopy or flexible sigmoidoscopy):  Excessive amounts of blood in the stool  Significant tenderness or worsening of abdominal pains  Swelling of the abdomen that is new, acute  Fever of 100F or higher   For urgent or emergent issues, a gastroenterologist can be reached at any hour by calling [phone].   DIET:  We do recommend a small meal at first, but then you may proceed to your regular diet.  Drink plenty of fluids but you should avoid alcoholic beverages for 24 hours.  ACTIVITY:  You should plan to take it easy for the rest of today and you should NOT DRIVE or use heavy machinery until tomorrow (because of the sedation medicines used during the test).     FOLLOW UP: Our staff will call the number listed on your records the next business day following your procedure to check on you and address any questions or concerns that you may have regarding the information given to you following your procedure. If we do not reach you, we will leave a message.  However, if you are feeling well and you are not experiencing any problems, there is no need to return our call.  We will assume that you have returned to your regular daily activities without incident.  If any biopsies were taken you will be contacted by phone or by letter within the next 1-3 weeks.  Please call us at [phone] if you have not heard about the biopsies in 3 weeks.    SIGNATURES/CONFIDENTIALITY: You and/or your care partner have signed paperwork which will be entered into your electronic medical record.  These signatures attest to the fact that that the information above on your After Visit Summary has been reviewed and is understood.  Full responsibility of the confidentiality of this discharge information lies with you and/or your care-partner.  Diverticulosis, hemorrhoids-handouts given  Repeat colonoscopy in 10 years 2027.

## 2016-09-17 NOTE — Progress Notes (Signed)
Started induction at approx 1358. Iv found to be partly infiltrated. (pt said he did get some drowsy but never fully sedated).  Iv in r antecube so hard to feel a true knot.  Pulled IV and no blood.  J Lazaria Schaben attempted three IV sticks on various points bilateral but at each one got good blood return but when tried to advance IV would blow.  Called for [employer] she tried one time and got same result. Then called for Izora Gala RN who tried at multiple points before finally getting in L [facility].  Pt of course didn't like to but stuck multiple times but he said they always have trouble when he gives blood.  He was asked multiple times too if he wanted [place] to comtinue and he said yes  Dr Henrene Pastor aware

## 2016-09-17 NOTE — Op Note (Signed)
[place] Patient Name: Jacob Caldwell Procedure Date: 09/17/2016 1:52 PM MRN: [id number] Endoscopist: Docia Chuck. Henrene Pastor , MD Age: 55 Referring MD:  Date of Birth: 1961-06-02 Gender: Male Account #: [id number] Procedure:                Colonoscopy Indications:              Surveillance: Personal history of adenomatous                            polyps on last colonoscopy 5 years ago, High risk                            colon cancer surveillance: Personal history of                            non-advanced adenoma. Index exam March 2012 Medicines:                Monitored Anesthesia Care Procedure:                Pre-Anesthesia Assessment:                           - Prior to the procedure, a History and Physical                            was performed, and patient medications and                            allergies were reviewed. The patient's tolerance of                            previous anesthesia was also reviewed. The risks                            and benefits of the procedure and the sedation                            options and risks were discussed with the patient.                            All questions were answered, and informed consent                            was obtained. Prior Anticoagulants: The patient has                            taken no previous anticoagulant or antiplatelet                            agents. ASA Grade Assessment: II - A patient with                            mild systemic disease. After reviewing the risks  and benefits, the patient was deemed in                            satisfactory condition to undergo the procedure.                           After obtaining informed consent, the colonoscope                            was passed under direct vision. Throughout the                            procedure, the patient's blood pressure, pulse, and                            oxygen saturations were  monitored continuously. The                            Model CF-HQ190L [id number]) scope was introduced                            through the anus and advanced to the the cecum,                            identified by appendiceal orifice and ileocecal                            valve. The ileocecal valve, appendiceal orifice,                            and rectum were photographed. The quality of the                            bowel preparation was excellent. The colonoscopy                            was performed without difficulty. The patient                            tolerated the procedure well. The bowel preparation                            used was SUPREP. Scope In: 2:51:08 PM Scope Out: 3:02:51 PM Scope Withdrawal Time: 0 hours 8 minutes 30 seconds  Total Procedure Duration: 0 hours 11 minutes 43 seconds  Findings:                 Multiple diverticula were found in the transverse                            colon and left colon.                           The exam was otherwise without abnormality on  direct and retroflexion views. Complications:            No immediate complications. Estimated blood loss:                            None. Estimated Blood Loss:     Estimated blood loss: none. Impression:               - Diverticulosis in the transverse colon and in the                            left colon.                           - The examination was otherwise normal on direct                            and retroflexion views. Internal hemorrhoids                            present.                           - No specimens collected. Recommendation:           - Repeat colonoscopy in 10 years for surveillance.                           - Patient has a contact number available for                            emergencies. The signs and symptoms of potential                            delayed complications were discussed with the                             patient. Return to normal activities tomorrow.                            Written discharge instructions were provided to the                            patient.                           - Resume previous diet.                           - Continue present medications. Docia Chuck. Henrene Pastor, MD 09/17/2016 3:08:11 PM This report has been signed electronically.

## 2016-09-18 ENCOUNTER — Telehealth: Payer: Self-pay

## 2016-09-18 NOTE — Telephone Encounter (Signed)
  Follow up Call-  Call back number 09/17/2016  Post procedure Call Back phone  # [phone]  Permission to leave phone message Yes  Some recent data might be hidden    Patient was called for follow up after his procedure on 09/17/2016. No answer at the number given for follow up phone call. A message was left on the answering machine.

## 2017-08-18 IMAGING — CR DG CHEST 2V
2 series · 2 of 2 positions shown · non-contrast
Comparison: None.

CLINICAL DATA: Short of breath.

EXAM:
CHEST  2 VIEW

[PA]
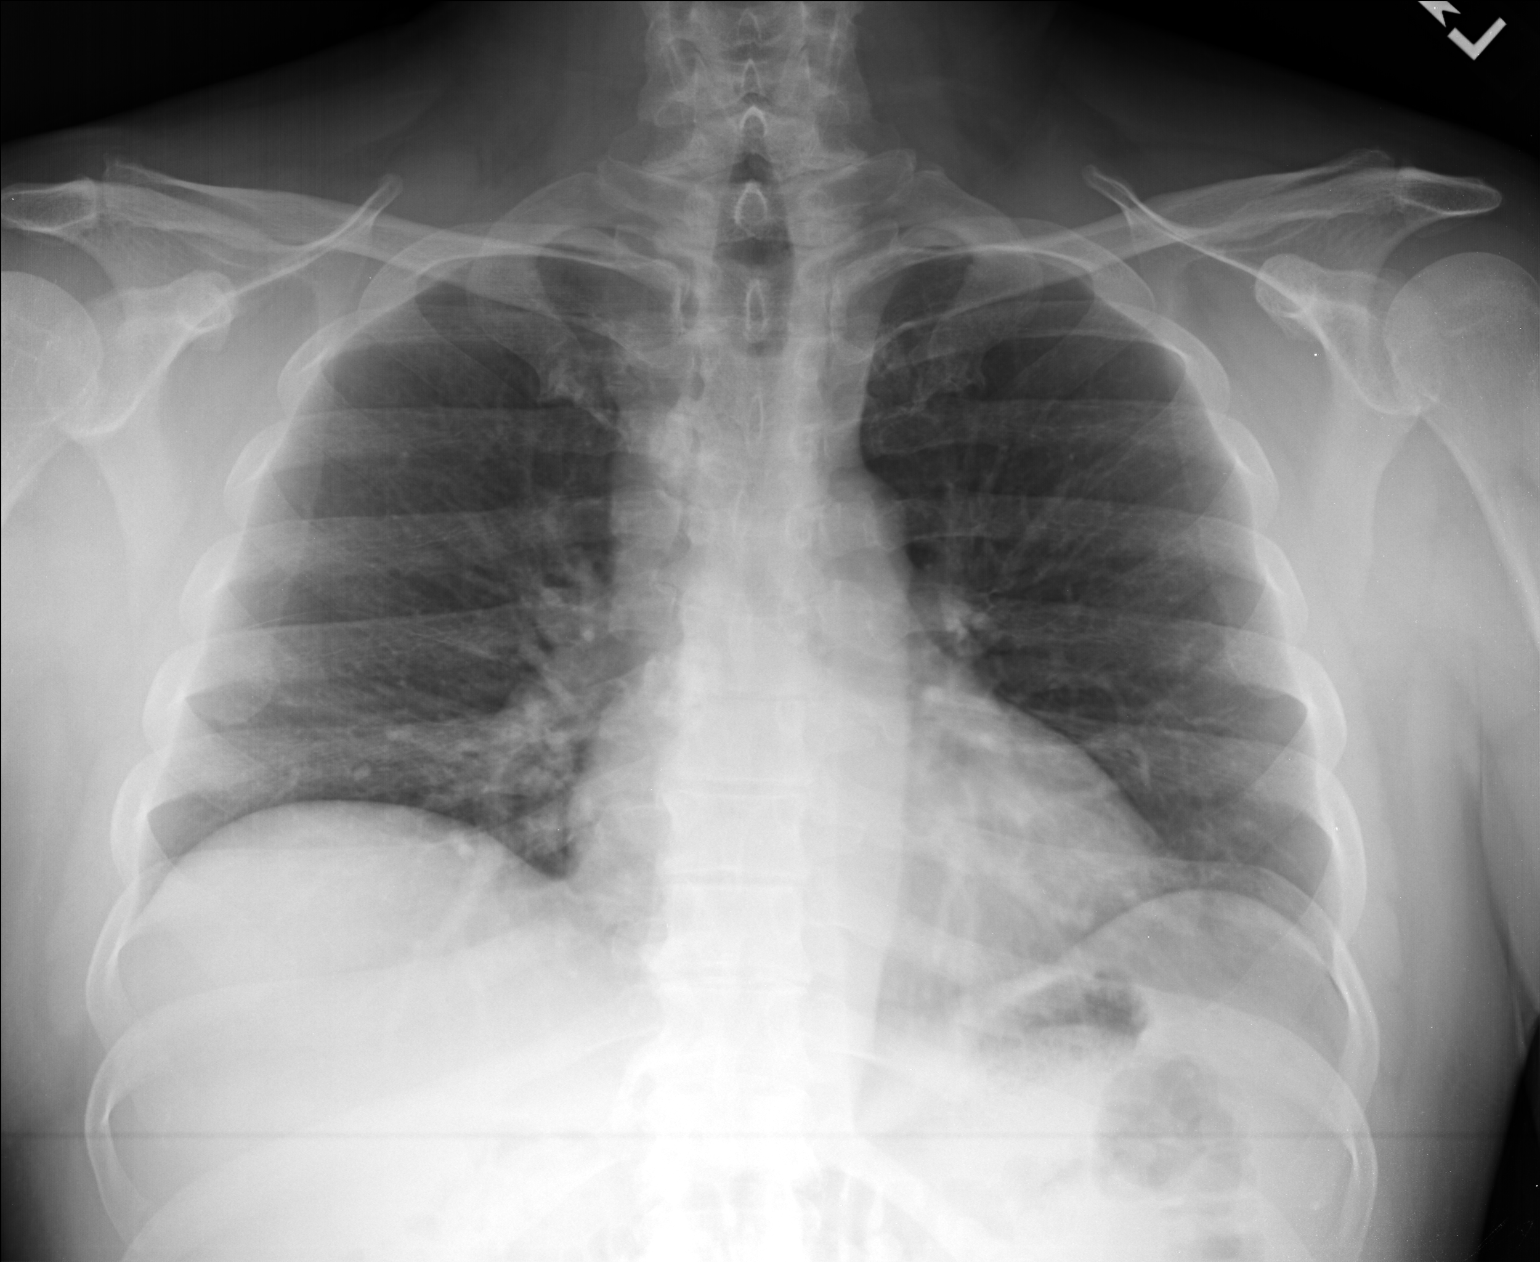

[lateral]
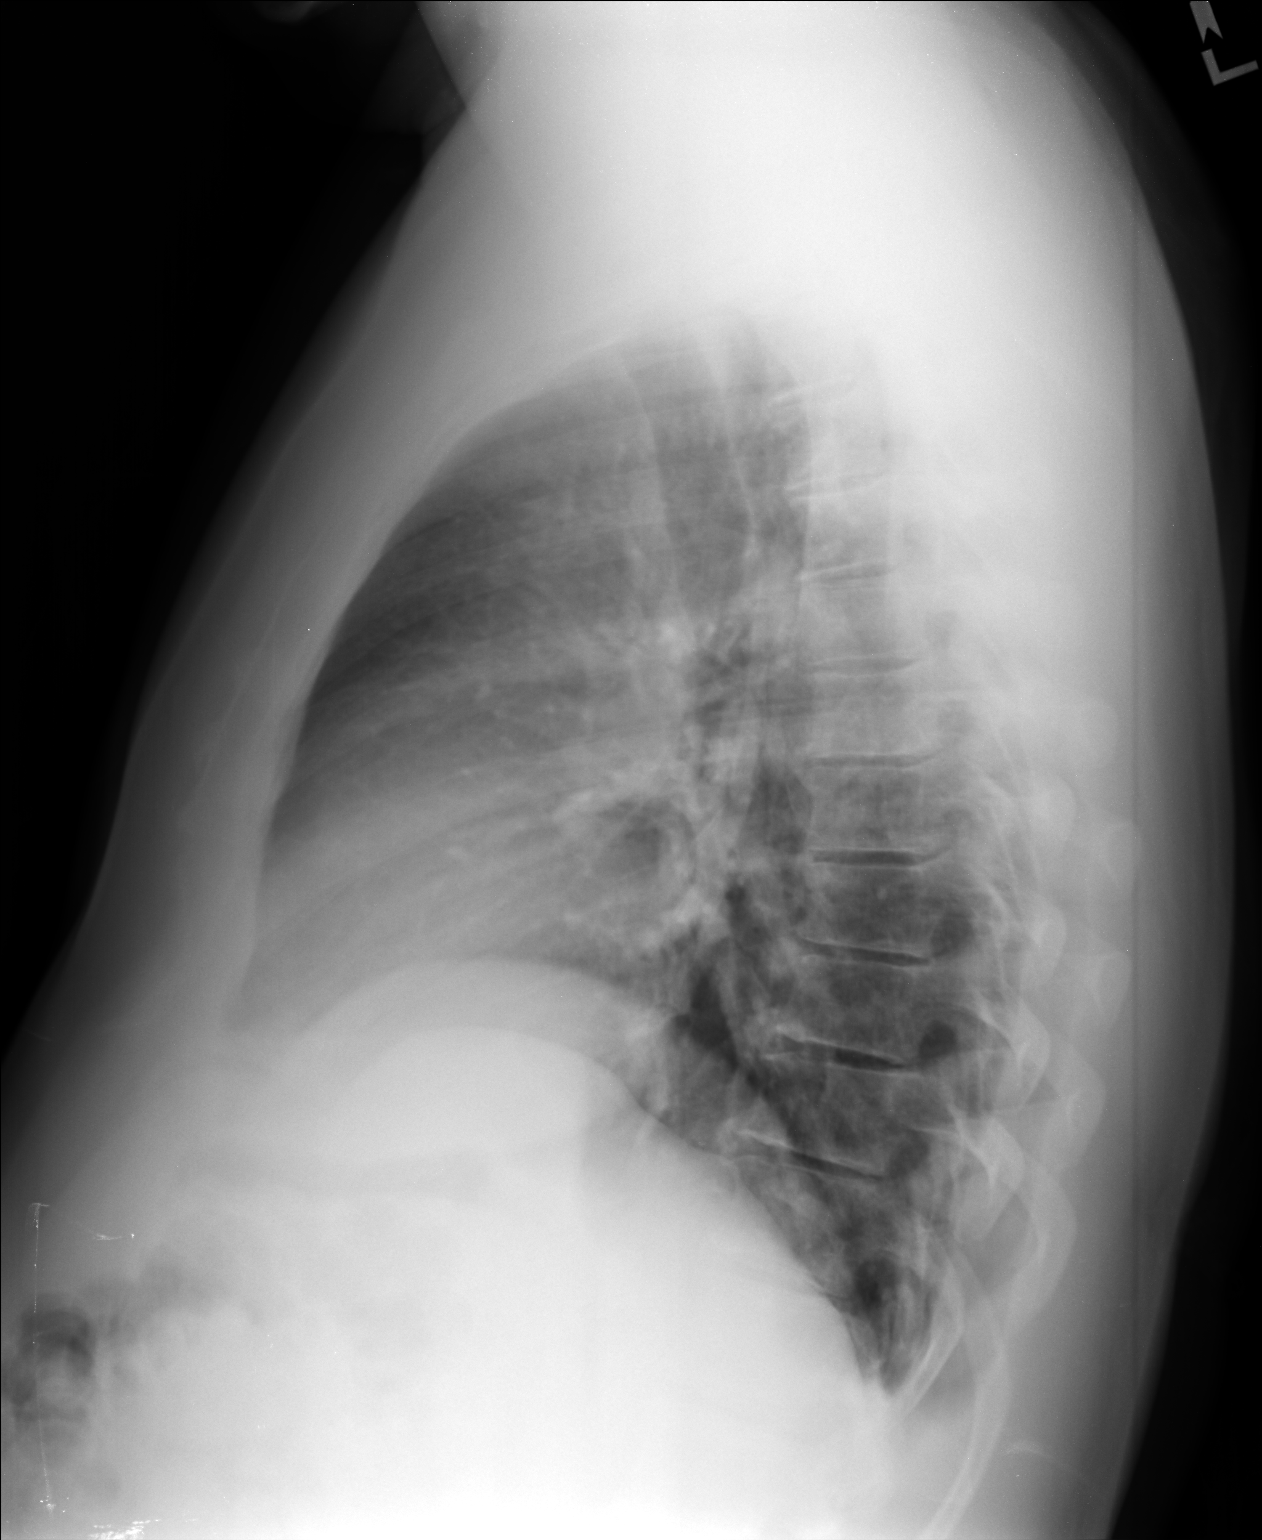

[2 of 2 positions shown; findings below may reference images not displayed]

FINDINGS: . Silhouette is normal in size and configuration. No mediastinal or
hilar masses or evidence of adenopathy.

Clear lungs.  No pleural effusion or pneumothorax.

Bony thorax is intact.
IMPRESSION: No active cardiopulmonary disease.

## 2017-09-19 ENCOUNTER — Encounter: Payer: Self-pay | Admitting: Internal Medicine

## 2018-07-21 ENCOUNTER — Encounter: Payer: BC Managed Care – PPO | Admitting: Internal Medicine

## 2018-08-05 ENCOUNTER — Encounter: Payer: Self-pay | Admitting: Internal Medicine

## 2018-08-05 ENCOUNTER — Ambulatory Visit (INDEPENDENT_AMBULATORY_CARE_PROVIDER_SITE_OTHER): Payer: BC Managed Care – PPO | Admitting: Internal Medicine

## 2018-08-05 VITALS — BP 130/82 | HR 59 | Temp 98.7°F | Ht 65.0 in | Wt 182.0 lb

## 2018-08-05 DIAGNOSIS — Z0000 Encounter for general adult medical examination without abnormal findings: Secondary | ICD-10-CM

## 2018-08-05 DIAGNOSIS — Z125 Encounter for screening for malignant neoplasm of prostate: Secondary | ICD-10-CM | POA: Diagnosis not present

## 2018-08-05 LAB — COMPREHENSIVE METABOLIC PANEL
ALT: 18 U/L (ref 0–53)
AST: 17 U/L (ref 0–37)
Albumin: 4.4 g/dL (ref 3.5–5.2)
Alkaline Phosphatase: 49 U/L (ref 39–117)
BILIRUBIN TOTAL: 0.5 mg/dL (ref 0.2–1.2)
BUN: 17 mg/dL (ref 6–23)
CALCIUM: 10.1 mg/dL (ref 8.4–10.5)
CO2: 28 meq/L (ref 19–32)
CREATININE: 1.07 mg/dL (ref 0.40–1.50)
Chloride: 101 mEq/L (ref 96–112)
GFR: 75.19 mL/min (ref >60.00)
Glucose, Bld: 102 mg/dL — ABNORMAL HIGH (ref 70–99)
Potassium: 4.2 mEq/L (ref 3.5–5.1)
Sodium: 137 mEq/L (ref 135–145)
TOTAL PROTEIN: 6.7 g/dL (ref 6.0–8.3)

## 2018-08-05 LAB — LIPID PANEL
Cholesterol: 199 mg/dL (ref 0–200)
HDL: 39 mg/dL — ABNORMAL LOW (ref >39.00)
LDL Cholesterol: 121 mg/dL — ABNORMAL HIGH (ref 0–99)
NonHDL: 159.65
Total CHOL/HDL Ratio: 5
Triglycerides: 194 mg/dL — ABNORMAL HIGH (ref 0.0–149.0)
VLDL: 38.8 mg/dL (ref 0.0–40.0)

## 2018-08-05 LAB — CBC WITH DIFFERENTIAL/PLATELET
BASOS PCT: 0.6 % (ref 0.0–3.0)
Basophils Absolute: 0 10*3/uL (ref 0.0–0.1)
Eosinophils Absolute: 0.2 10*3/uL (ref 0.0–0.7)
Eosinophils Relative: 3 % (ref 0.0–5.0)
HCT: 42.4 % (ref 39.0–52.0)
Hemoglobin: 14.5 g/dL (ref 13.0–17.0)
LYMPHS ABS: 2.2 10*3/uL (ref 0.7–4.0)
Lymphocytes Relative: 32.8 % (ref 12.0–46.0)
MCHC: 34.3 g/dL (ref 30.0–36.0)
MCV: 87.1 fl (ref 78.0–100.0)
MONO ABS: 0.5 10*3/uL (ref 0.1–1.0)
Monocytes Relative: 8.1 % (ref 3.0–12.0)
NEUTROS ABS: 3.7 10*3/uL (ref 1.4–7.7)
NEUTROS PCT: 55.5 % (ref 43.0–77.0)
PLATELETS: 216 10*3/uL (ref 150.0–400.0)
RBC: 4.86 Mil/uL (ref 4.22–5.81)
RDW: 13.5 % (ref 11.5–15.5)
WBC: 6.7 10*3/uL (ref 4.0–10.5)

## 2018-08-05 LAB — TSH: TSH: 2.68 u[IU]/mL (ref 0.35–4.50)

## 2018-08-05 LAB — PSA: PSA: 0.76 ng/mL (ref 0.10–4.00)

## 2018-08-05 NOTE — Progress Notes (Signed)
Subjective:    Patient ID: Jacob Caldwell, male    DOB: Nov 27, 1961, 57 y.o.   MRN: [id number]  HPI  57 year old patient who is seen today for a preventive health examination. He is doing quite well without concerns or complaints.  He does have a history of colonic polyps and did have follow-up colonoscopy performed in 2017.  He was free of polyps and a 10-year interval was suggested.    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  Plays tennis 3-4 times per week and goes to the local gym at least once weekly.  Walks throughout the week.  Also with his wife  Tobacco use: never       Review of Systems  Constitutional: Negative for appetite change, chills, fatigue and fever.  HENT: Negative for congestion, dental problem, ear pain, hearing loss, sore throat, tinnitus, trouble swallowing and voice change.   Eyes: Negative for pain, discharge and visual disturbance.  Respiratory: Negative for cough, chest tightness, wheezing and stridor.   Cardiovascular: Negative for chest pain, palpitations and leg swelling.  Gastrointestinal: Negative for abdominal distention, abdominal pain, blood in stool, constipation, diarrhea, nausea and vomiting.  Genitourinary: Negative for difficulty urinating, discharge, flank pain, genital sores, hematuria and urgency.  Musculoskeletal: Negative for arthralgias, back pain, gait problem, joint swelling, myalgias and neck  stiffness.  Skin: Negative for rash.  Neurological: Negative for dizziness, syncope, speech difficulty, weakness, numbness and headaches.  Hematological: Negative for adenopathy. Does not bruise/bleed easily.  Psychiatric/Behavioral: Negative for behavioral problems and dysphoric mood. The patient is not nervous/anxious.        Objective:   Physical Exam  Constitutional: He appears well-developed and well-nourished.  Weight 182 Blood pressure 130/80  HENT:  Head: Normocephalic and atraumatic.  Right Ear: External ear normal.  Left Ear: External ear normal.  Nose: Nose normal.  Mouth/Throat: Oropharynx is clear and moist.  Eyes: Pupils are equal, round, and reactive to light. Conjunctivae and EOM are normal. No scleral icterus.  Neck: Normal range of motion. Neck supple. No JVD present. No thyromegaly present.  Cardiovascular: Regular rhythm, normal heart sounds and intact distal pulses. Exam reveals no gallop and no friction rub.  No murmur heard. Pulmonary/Chest: Effort normal and breath sounds normal. He exhibits no tenderness.  Abdominal: Soft. Bowel sounds are normal. He exhibits no distension and no mass. There is no tenderness.  Genitourinary: Prostate normal and penis normal. Rectal exam shows guaiac negative stool.  Musculoskeletal: Normal range of motion. He exhibits no edema or tenderness.  Lymphadenopathy:    He has no cervical adenopathy.  Neurological: He is alert. He has normal reflexes. No cranial nerve deficit. Coordination normal.  Skin: Skin is warm and dry. No rash noted.  Psychiatric: He has a normal mood and affect. His behavior is normal.          Assessment & Plan:   Preventive health examination History of colonic polyps.  Last colonoscopy 2017  Will check updated lab Continue heart  healthy diet and active lifestyle  Patient will follow-up with a new provider in approximately 1 year Vaccinations up-to-date  Marletta Lor

## 2018-08-05 NOTE — Patient Instructions (Signed)

## 2018-08-06 LAB — HEPATITIS C ANTIBODY
HEP C AB: NONREACTIVE
SIGNAL TO CUT-OFF: 0.02 (ref <1.00)

## 2018-08-15 ENCOUNTER — Ambulatory Visit: Payer: BC Managed Care – PPO | Admitting: Sports Medicine

## 2019-09-08 ENCOUNTER — Ambulatory Visit (INDEPENDENT_AMBULATORY_CARE_PROVIDER_SITE_OTHER): Payer: BC Managed Care – PPO | Admitting: Family Medicine

## 2019-09-08 ENCOUNTER — Encounter: Payer: Self-pay | Admitting: Family Medicine

## 2019-09-08 ENCOUNTER — Other Ambulatory Visit: Payer: Self-pay

## 2019-09-08 VITALS — BP 128/80 | HR 60 | Temp 97.3°F | Ht 65.0 in | Wt 182.2 lb

## 2019-09-08 DIAGNOSIS — R739 Hyperglycemia, unspecified: Secondary | ICD-10-CM | POA: Diagnosis not present

## 2019-09-08 DIAGNOSIS — Z23 Encounter for immunization: Secondary | ICD-10-CM | POA: Diagnosis not present

## 2019-09-08 DIAGNOSIS — Z125 Encounter for screening for malignant neoplasm of prostate: Secondary | ICD-10-CM

## 2019-09-08 DIAGNOSIS — E785 Hyperlipidemia, unspecified: Secondary | ICD-10-CM

## 2019-09-08 DIAGNOSIS — Z0001 Encounter for general adult medical examination with abnormal findings: Secondary | ICD-10-CM | POA: Diagnosis not present

## 2019-09-08 DIAGNOSIS — M1990 Unspecified osteoarthritis, unspecified site: Secondary | ICD-10-CM

## 2019-09-08 DIAGNOSIS — E669 Obesity, unspecified: Secondary | ICD-10-CM

## 2019-09-08 DIAGNOSIS — Z6830 Body mass index (BMI) 30.0-30.9, adult: Secondary | ICD-10-CM

## 2019-09-08 LAB — COMPREHENSIVE METABOLIC PANEL
ALT: 17 U/L (ref 0–53)
AST: 17 U/L (ref 0–37)
Albumin: 4.3 g/dL (ref 3.5–5.2)
Alkaline Phosphatase: 56 U/L (ref 39–117)
BUN: 16 mg/dL (ref 6–23)
CO2: 28 mEq/L (ref 19–32)
Calcium: 9.6 mg/dL (ref 8.4–10.5)
Chloride: 102 mEq/L (ref 96–112)
Creatinine, Ser: 1.06 mg/dL (ref 0.40–1.50)
GFR: 71.25 mL/min (ref >60.00)
Glucose, Bld: 142 mg/dL — ABNORMAL HIGH (ref 70–99)
Potassium: 4.3 mEq/L (ref 3.5–5.1)
Sodium: 137 mEq/L (ref 135–145)
Total Bilirubin: 0.5 mg/dL (ref 0.2–1.2)
Total Protein: 6.6 g/dL (ref 6.0–8.3)

## 2019-09-08 LAB — CBC
HCT: 43.1 % (ref 39.0–52.0)
Hemoglobin: 14.8 g/dL (ref 13.0–17.0)
MCHC: 34.3 g/dL (ref 30.0–36.0)
MCV: 88.2 fl (ref 78.0–100.0)
Platelets: 199 10*3/uL (ref 150.0–400.0)
RBC: 4.89 Mil/uL (ref 4.22–5.81)
RDW: 13.1 % (ref 11.5–15.5)
WBC: 5 10*3/uL (ref 4.0–10.5)

## 2019-09-08 LAB — LIPID PANEL
Cholesterol: 198 mg/dL (ref 0–200)
HDL: 35.2 mg/dL — ABNORMAL LOW (ref >39.00)
LDL Cholesterol: 130 mg/dL — ABNORMAL HIGH (ref 0–99)
NonHDL: 163.1
Total CHOL/HDL Ratio: 6
Triglycerides: 164 mg/dL — ABNORMAL HIGH (ref 0.0–149.0)
VLDL: 32.8 mg/dL (ref 0.0–40.0)

## 2019-09-08 LAB — PSA: PSA: 1.09 ng/mL (ref 0.10–4.00)

## 2019-09-08 LAB — HEMOGLOBIN A1C: Hgb A1c MFr Bld: 7 % — ABNORMAL HIGH (ref 4.6–6.5)

## 2019-09-08 LAB — TSH: TSH: 2.42 u[IU]/mL (ref 0.35–4.50)

## 2019-09-08 NOTE — Assessment & Plan Note (Signed)
Continue ibuprofen as needed. Recommended compression and ice as needed as well.

## 2019-09-08 NOTE — Progress Notes (Signed)
Please inform patient of the following:  Blood sugar is high and in the diabetic range. Recommend starting metformin 750mg  daily. He should also continue to work on diet and exercise. Would like to recheck in 3-6 months.   All of his other labs including his cholesterol levels are STABLE. Would like for him to keep up the good work with diet and exercise as we can recheck in a year.

## 2019-09-08 NOTE — Progress Notes (Signed)
Chief Complaint:  Jacob Caldwell is a 58 y.o. male who presents today for his annual comprehensive physical exam.    Assessment/Plan:  Arthritis Continue ibuprofen as needed. Recommended compression and ice as needed as well.   Hyperglycemia Check A1c.   Body mass index is 30.33 kg/m. / Obese BMI Metric Follow Up - 09/08/19 0904      BMI Metric Follow Up-Please document annually   BMI Metric Follow Up  Education provided        Preventative Healthcare: FLu vaccine today. Check CBC, CMET, TSH, Lipid panel, and PSA.  Patient Counseling(The following topics were reviewed and/or handout was given):  -Nutrition: Stressed importance of moderation in sodium/caffeine intake, saturated fat and cholesterol, caloric balance, sufficient intake of fresh fruits, vegetables, and fiber.  -Stressed the importance of regular exercise.   -Substance Abuse: Discussed cessation/primary prevention of tobacco, alcohol, or other drug use; driving or other dangerous activities under the influence; availability of treatment for abuse.   -Injury prevention: Discussed safety belts, safety helmets, smoke detector, smoking near bedding or upholstery.   -Sexuality: Discussed sexually transmitted diseases, partner selection, use of condoms, avoidance of unintended pregnancy and contraceptive alternatives.   -Dental health: Discussed importance of regular tooth brushing, flossing, and dental visits.  -Health maintenance and immunizations reviewed. Please refer to Health maintenance section.  Return to care in 1 year for next preventative visit.     Subjective:  HPI:  He has no acute complaints today.   His stable, chronic medical conditions are outlined below:   # Arthritis - Takes ibuprofen as needed  Lifestyle Diet: No specific diets or eating plans. Tries to eat a balanced diet with plenty of fruits and vegetables.  Exercise: Likes to play tennis.   Depression screen PHQ 2/9 09/08/2019  Decreased  Interest 0  Down, Depressed, Hopeless 0  PHQ - 2 Score 0    Health Maintenance Due  Topic Date Due  . HIV Screening  08/18/1974  . INFLUENZA VACCINE  08/01/2019     ROS: Per HPI, otherwise a complete review of systems was negative.   PMH:  The following were reviewed and entered/updated in epic: Past Medical History:  Diagnosis Date  . Arthritis   . Headache    Patient Active Problem List   Diagnosis Date Noted  . Arthritis   . History of colonic polyps 07/10/2016   Past Surgical History:  Procedure Laterality Date  . EYE SURGERY    . VASECTOMY  2003    Family History  Problem Relation Age of Onset  . Memory loss Mother   . Diabetes Maternal Grandfather   . Colon cancer Neg Hx     Medications- reviewed and updated Current Outpatient Medications  Medication Sig Dispense Refill  . ibuprofen (ADVIL) 400 MG tablet Take 400 mg by mouth every 6 (six) hours as needed.     No current facility-administered medications for this visit.     Allergies-reviewed and updated Allergies  Allergen Reactions  . Diazepam     REACTION: hyperactive  . Penicillins     REACTION: hives    Social History   Socioeconomic History  . Marital status: Married    Spouse name: Not on file  . Number of children: Not on file  . Years of education: Not on file  . Highest education level: Not on file  Occupational History  . Not on file  Social Needs  . Financial resource strain: Not on file  . Food insecurity  Worry: Not on file    Inability: Not on file  . Transportation needs    Medical: Not on file    Non-medical: Not on file  Tobacco Use  . Smoking status: Never Smoker  . Smokeless tobacco: Never Used  Substance and Sexual Activity  . Alcohol use: Yes    Alcohol/week: 4.0 standard drinks    Types: 4 Standard drinks or equivalent per week    Comment: combo of wine, beer, liquor  . Drug use: No  . Sexual activity: Not on file  Lifestyle  . Physical activity     Days per week: Not on file    Minutes per session: Not on file  . Stress: Not on file  Relationships  . Social Herbalist on phone: Not on file    Gets together: Not on file    Attends religious service: Not on file    Active member of club or organization: Not on file    Attends meetings of clubs or organizations: Not on file    Relationship status: Not on file  Other Topics Concern  . Not on file  Social History Narrative   Married   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[employer]        Objective:  Physical Exam: BP 128/80   Pulse 60   Temp (!) 97.3 F (36.3 C)   Ht 5\' 5"  (1.651 m)   Wt 182 lb 4 oz (82.7 kg)   SpO2 98%   BMI 30.33 kg/m   Body mass index is 30.33 kg/m. Wt Readings from Last 3 Encounters:  09/08/19 182 lb 4 oz (82.7 kg)  08/05/18 182 lb (82.6 kg)  09/17/16 183 lb (83 kg)   Gen: NAD, resting comfortably HEENT: TMs normal bilaterally. OP clear. No thyromegaly noted.  CV: RRR with no murmurs appreciated Pulm: NWOB, CTAB with no crackles, wheezes, or rhonchi GI: Normal bowel sounds present. Soft, Nontender, Nondistended. MSK: no edema, cyanosis, or clubbing noted Skin: warm, dry Neuro: CN2-12 grossly intact. Strength 5/5 in upper and lower extremities. Reflexes symmetric and intact bilaterally.  Psych: Normal affect and thought content      M. Jerline Pain, MD 09/08/2019 9:05 AM

## 2019-09-08 NOTE — Patient Instructions (Signed)
It was very nice to see you today!  Keep up the good work!  No changes today.  We will give you your flu vaccine and check blood work.  Come back in 1 year for your next physical, or sooner if needed.   Take care, Dr Jerline Pain  Please try these tips to maintain a healthy lifestyle:   Eat at least 3 REAL meals and 1-2 snacks per day.  Aim for no more than 5 hours between eating.  If you eat breakfast, please do so within one hour of getting up.    Obtain twice as many fruits/vegetables as protein or carbohydrate foods for both lunch and dinner. (Half of each meal should be fruits/vegetables, one quarter protein, and one quarter starchy carbs)   Cut down on sweet beverages. This includes juice, soda, and sweet tea.    Exercise at least 150 minutes every week.    Preventive Care 28-23 Years Old, Male Preventive care refers to lifestyle choices and visits with your health care provider that can promote health and wellness. This includes:  A yearly physical exam. This is also called an annual well check.  Regular dental and eye exams.  Immunizations.  Screening for certain conditions.  Healthy lifestyle choices, such as eating a healthy diet, getting regular exercise, not using drugs or products that contain nicotine and tobacco, and limiting alcohol use. What can I expect for my preventive care visit? Physical exam Your health care provider will check:  Height and weight. These may be used to calculate body mass index (BMI), which is a measurement that tells if you are at a healthy weight.  Heart rate and blood pressure.  Your skin for abnormal spots. Counseling Your health care provider may ask you questions about:  Alcohol, tobacco, and drug use.  Emotional well-being.  Home and relationship well-being.  Sexual activity.  Eating habits.  Work and work Statistician. What immunizations do I need?  Influenza (flu) vaccine  This is recommended every year.  Tetanus, diphtheria, and pertussis (Tdap) vaccine  You may need a Td booster every 10 years. Varicella (chickenpox) vaccine  You may need this vaccine if you have not already been vaccinated. Zoster (shingles) vaccine  You may need this after age 29. Measles, mumps, and rubella (MMR) vaccine  You may need at least one dose of MMR if you were born in 1957 or later. You may also need a second dose. Pneumococcal conjugate (PCV13) vaccine  You may need this if you have certain conditions and were not previously vaccinated. Pneumococcal polysaccharide (PPSV23) vaccine  You may need one or two doses if you smoke cigarettes or if you have certain conditions. Meningococcal conjugate (MenACWY) vaccine  You may need this if you have certain conditions. Hepatitis A vaccine  You may need this if you have certain conditions or if you travel or work in places where you may be exposed to hepatitis A. Hepatitis B vaccine  You may need this if you have certain conditions or if you travel or work in places where you may be exposed to hepatitis B. Haemophilus influenzae type b (Hib) vaccine  You may need this if you have certain risk factors. Human papillomavirus (HPV) vaccine  If recommended by your health care provider, you may need three doses over 6 months. You may receive vaccines as individual doses or as more than one vaccine together in one shot (combination vaccines). Talk with your health care provider about the risks and benefits of combination  vaccines. What tests do I need? Blood tests  Lipid and cholesterol levels. These may be checked every 5 years, or more frequently if you are over 32 years old.  Hepatitis C test.  Hepatitis B test. Screening  Lung cancer screening. You may have this screening every year starting at age 68 if you have a 30-pack-year history of smoking and currently smoke or have quit within the past 15 years.  Prostate cancer screening. Recommendations  will vary depending on your family history and other risks.  Colorectal cancer screening. All adults should have this screening starting at age 58 and continuing until age 45. Your health care provider may recommend screening at age 66 if you are at increased risk. You will have tests every 1-10 years, depending on your results and the type of screening test.  Diabetes screening. This is done by checking your blood sugar (glucose) after you have not eaten for a while (fasting). You may have this done every 1-3 years.  Sexually transmitted disease (STD) testing. Follow these instructions at home: Eating and drinking  Eat a diet that includes fresh fruits and vegetables, whole grains, lean protein, and low-fat dairy products.  Take vitamin and mineral supplements as recommended by your health care provider.  Do not drink alcohol if your health care provider tells you not to drink.  If you drink alcohol: ? Limit how much you have to 0-2 drinks a day. ? Be aware of how much alcohol is in your drink. In the [place] one drink equals one 12 oz bottle of beer (355 mL), one 5 oz glass of wine (148 mL), or one 1 oz glass of hard liquor (44 mL). Lifestyle  Take daily care of your teeth and gums.  Stay active. Exercise for at least 30 minutes on 5 or more days each week.  Do not use any products that contain nicotine or tobacco, such as cigarettes, e-cigarettes, and chewing tobacco. If you need help quitting, ask your health care provider.  If you are sexually active, practice safe sex. Use a condom or other form of protection to prevent STIs (sexually transmitted infections).  Talk with your health care provider about taking a low-dose aspirin every day starting at age 79. What's next?  Go to your health care provider once a year for a well check visit.  Ask your health care provider how often you should have your eyes and teeth checked.  Stay up to date on all vaccines. This information is  not intended to replace advice given to you by your health care provider. Make sure you discuss any questions you have with your health care provider. Document Released: 01/13/2016 Document Revised: 12/11/2018 Document Reviewed: 12/11/2018 Elsevier Patient Education  2020 [employer].

## 2020-10-24 ENCOUNTER — Ambulatory Visit (INDEPENDENT_AMBULATORY_CARE_PROVIDER_SITE_OTHER): Payer: BC Managed Care – PPO | Admitting: Family Medicine

## 2020-10-24 ENCOUNTER — Encounter: Payer: Self-pay | Admitting: Family Medicine

## 2020-10-24 ENCOUNTER — Other Ambulatory Visit: Payer: Self-pay

## 2020-10-24 VITALS — BP 144/77 | HR 61 | Temp 98.2°F | Ht 65.0 in | Wt 185.4 lb

## 2020-10-24 DIAGNOSIS — Z125 Encounter for screening for malignant neoplasm of prostate: Secondary | ICD-10-CM

## 2020-10-24 DIAGNOSIS — Z0001 Encounter for general adult medical examination with abnormal findings: Secondary | ICD-10-CM | POA: Diagnosis not present

## 2020-10-24 DIAGNOSIS — E785 Hyperlipidemia, unspecified: Secondary | ICD-10-CM | POA: Diagnosis not present

## 2020-10-24 DIAGNOSIS — R739 Hyperglycemia, unspecified: Secondary | ICD-10-CM

## 2020-10-24 NOTE — Assessment & Plan Note (Signed)
Check A1c. 

## 2020-10-24 NOTE — Assessment & Plan Note (Signed)
Discussed lifestyle modifications.  Check lipids, CBC, CMET, TSH.

## 2020-10-24 NOTE — Patient Instructions (Signed)
It was very nice to see you today!  We will check blood work today.  Please continue working on diet and exercise.  I will see back in a year for your next physical.  Please come back to see me sooner if needed.  Take care, Dr Jerline Pain  Please try these tips to maintain a healthy lifestyle:   Eat at least 3 REAL meals and 1-2 snacks per day.  Aim for no more than 5 hours between eating.  If you eat breakfast, please do so within one hour of getting up.    Each meal should contain half fruits/vegetables, one quarter protein, and one quarter carbs (no bigger than a computer mouse)   Cut down on sweet beverages. This includes juice, soda, and sweet tea.     Drink at least 1 glass of water with each meal and aim for at least 8 glasses per day   Exercise at least 150 minutes every week.    Preventive Care 31-27 Years Old, Male Preventive care refers to lifestyle choices and visits with your health care provider that can promote health and wellness. This includes:  A yearly physical exam. This is also called an annual well check.  Regular dental and eye exams.  Immunizations.  Screening for certain conditions.  Healthy lifestyle choices, such as eating a healthy diet, getting regular exercise, not using drugs or products that contain nicotine and tobacco, and limiting alcohol use. What can I expect for my preventive care visit? Physical exam Your health care provider will check:  Height and weight. These may be used to calculate body mass index (BMI), which is a measurement that tells if you are at a healthy weight.  Heart rate and blood pressure.  Your skin for abnormal spots. Counseling Your health care provider may ask you questions about:  Alcohol, tobacco, and drug use.  Emotional well-being.  Home and relationship well-being.  Sexual activity.  Eating habits.  Work and work Statistician. What immunizations do I need?  Influenza (flu) vaccine  This is  recommended every year. Tetanus, diphtheria, and pertussis (Tdap) vaccine  You may need a Td booster every 10 years. Varicella (chickenpox) vaccine  You may need this vaccine if you have not already been vaccinated. Zoster (shingles) vaccine  You may need this after age 80. Measles, mumps, and rubella (MMR) vaccine  You may need at least one dose of MMR if you were born in 1957 or later. You may also need a second dose. Pneumococcal conjugate (PCV13) vaccine  You may need this if you have certain conditions and were not previously vaccinated. Pneumococcal polysaccharide (PPSV23) vaccine  You may need one or two doses if you smoke cigarettes or if you have certain conditions. Meningococcal conjugate (MenACWY) vaccine  You may need this if you have certain conditions. Hepatitis A vaccine  You may need this if you have certain conditions or if you travel or work in places where you may be exposed to hepatitis A. Hepatitis B vaccine  You may need this if you have certain conditions or if you travel or work in places where you may be exposed to hepatitis B. Haemophilus influenzae type b (Hib) vaccine  You may need this if you have certain risk factors. Human papillomavirus (HPV) vaccine  If recommended by your health care provider, you may need three doses over 6 months. You may receive vaccines as individual doses or as more than one vaccine together in one shot (combination vaccines). Talk with  your health care provider about the risks and benefits of combination vaccines. What tests do I need? Blood tests  Lipid and cholesterol levels. These may be checked every 5 years, or more frequently if you are over 41 years old.  Hepatitis C test.  Hepatitis B test. Screening  Lung cancer screening. You may have this screening every year starting at age 98 if you have a 30-pack-year history of smoking and currently smoke or have quit within the past 15 years.  Prostate cancer  screening. Recommendations will vary depending on your family history and other risks.  Colorectal cancer screening. All adults should have this screening starting at age 22 and continuing until age 51. Your health care provider may recommend screening at age 40 if you are at increased risk. You will have tests every 1-10 years, depending on your results and the type of screening test.  Diabetes screening. This is done by checking your blood sugar (glucose) after you have not eaten for a while (fasting). You may have this done every 1-3 years.  Sexually transmitted disease (STD) testing. Follow these instructions at home: Eating and drinking  Eat a diet that includes fresh fruits and vegetables, whole grains, lean protein, and low-fat dairy products.  Take vitamin and mineral supplements as recommended by your health care provider.  Do not drink alcohol if your health care provider tells you not to drink.  If you drink alcohol: ? Limit how much you have to 0-2 drinks a day. ? Be aware of how much alcohol is in your drink. In the [place] one drink equals one 12 oz bottle of beer (355 mL), one 5 oz glass of wine (148 mL), or one 1 oz glass of hard liquor (44 mL). Lifestyle  Take daily care of your teeth and gums.  Stay active. Exercise for at least 30 minutes on 5 or more days each week.  Do not use any products that contain nicotine or tobacco, such as cigarettes, e-cigarettes, and chewing tobacco. If you need help quitting, ask your health care provider.  If you are sexually active, practice safe sex. Use a condom or other form of protection to prevent STIs (sexually transmitted infections).  Talk with your health care provider about taking a low-dose aspirin every day starting at age 55. What's next?  Go to your health care provider once a year for a well check visit.  Ask your health care provider how often you should have your eyes and teeth checked.  Stay up to date on all  vaccines. This information is not intended to replace advice given to you by your health care provider. Make sure you discuss any questions you have with your health care provider. Document Revised: 12/11/2018 Document Reviewed: 12/11/2018 Elsevier Patient Education  2020 [employer].

## 2020-10-24 NOTE — Progress Notes (Signed)
Chief Complaint:  Jvion Turgeon is a 59 y.o. male who presents today for his annual comprehensive physical exam.    Assessment/Plan:  Chronic Problems Addressed Today: Dyslipidemia Discussed lifestyle modifications.  Check lipids, CBC, CMET, TSH.  Hyperglycemia Check A1c.  Preventative Healthcare: Discussed shingles vaccine.  Up-to-date on other vaccines and screenings.  Check labs including CBC, CMET, TSH, lipid panel, and PSA.  Patient Counseling(The following topics were reviewed and/or handout was given):  -Nutrition: Stressed importance of moderation in sodium/caffeine intake, saturated fat and cholesterol, caloric balance, sufficient intake of fresh fruits, vegetables, and fiber.  -Stressed the importance of regular exercise.   -Substance Abuse: Discussed cessation/primary prevention of tobacco, alcohol, or other drug use; driving or other dangerous activities under the influence; availability of treatment for abuse.   -Injury prevention: Discussed safety belts, safety helmets, smoke detector, smoking near bedding or upholstery.   -Sexuality: Discussed sexually transmitted diseases, partner selection, use of condoms, avoidance of unintended pregnancy and contraceptive alternatives.   -Dental health: Discussed importance of regular tooth brushing, flossing, and dental visits.  -Health maintenance and immunizations reviewed. Please refer to Health maintenance section.  Return to care in 1 year for next preventative visit.     Subjective:  HPI:  He has no acute complaints today.   Lifestyle Diet: Trying to cut down on sweets and candy.  Exercise: Likes to play tennis 3-4 times per week. Works on exercise bike. Likes to walk a lot.   Depression screen PHQ 2/9 10/24/2020  Decreased Interest 0  Down, Depressed, Hopeless 0  PHQ - 2 Score 0    Health Maintenance Due  Topic Date Due  . HIV Screening  Never done     ROS: Per HPI, otherwise a complete review of systems  was negative.   PMH:  The following were reviewed and entered/updated in epic: Past Medical History:  Diagnosis Date  . Arthritis   . Headache    Patient Active Problem List   Diagnosis Date Noted  . Hyperglycemia 10/24/2020  . Dyslipidemia 10/24/2020  . Arthritis   . History of colonic polyps 07/10/2016   Past Surgical History:  Procedure Laterality Date  . EYE SURGERY    . VASECTOMY  2003    Family History  Problem Relation Age of Onset  . Memory loss Mother   . Diabetes Maternal Grandfather   . Colon cancer Neg Hx     Medications- reviewed and updated Current Outpatient Medications  Medication Sig Dispense Refill  . Multiple Vitamin (MULTIVITAMIN) tablet Take 1 tablet by mouth daily.    Marland Kitchen ibuprofen (ADVIL) 400 MG tablet Take 400 mg by mouth every 6 (six) hours as needed.     No current facility-administered medications for this visit.    Allergies-reviewed and updated Allergies  Allergen Reactions  . Diazepam     REACTION: hyperactive  . Penicillins     REACTION: hives    Social History   Socioeconomic History  . Marital status: Married    Spouse name: Not on file  . Number of children: Not on file  . Years of education: Not on file  . Highest education level: Not on file  Occupational History  . Not on file  Tobacco Use  . Smoking status: Never Smoker  . Smokeless tobacco: Never Used  Substance and Sexual Activity  . Alcohol use: Yes    Alcohol/week: 4.0 standard drinks    Types: 4 Standard drinks or equivalent per week    Comment:  combo of wine, beer, liquor  . Drug use: No  . Sexual activity: Not on file  Other Topics Concern  . 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 Difficulty of Paying Living Expenses: Not on file  Food Insecurity:   . Worried About Charity fundraiser in the Last Year: Not on file  . Ran Out of Food in the Last  Year: Not on file  Transportation Needs:   . Lack of Transportation (Medical): Not on file  . Lack of Transportation (Non-Medical): Not on file  Physical Activity:   . Days of Exercise per Week: Not on file  . Minutes of Exercise per Session: Not on file  Stress:   . Feeling of Stress : Not on file  Social Connections:   . Frequency of Communication with Friends and Family: Not on file  . Frequency of Social Gatherings with Friends and Family: Not on file  . Attends Religious Services: Not on file  . Active Member of Clubs or Organizations: Not on file  . Attends Archivist Meetings: Not on file  . Marital Status: Not on file        Objective:  Physical Exam: BP (!) 144/77   Pulse 61   Temp 98.2 F (36.8 C) (Temporal)   Ht 5\' 5"  (1.651 m)   Wt 185 lb 6.4 oz (84.1 kg)   SpO2 97%   BMI 30.85 kg/m   Body mass index is 30.85 kg/m. Wt Readings from Last 3 Encounters:  10/24/20 185 lb 6.4 oz (84.1 kg)  09/08/19 182 lb 4 oz (82.7 kg)  08/05/18 182 lb (82.6 kg)   Gen: NAD, resting comfortably HEENT: TMs normal bilaterally. OP clear. No thyromegaly noted.  CV: RRR with no murmurs appreciated Pulm: NWOB, CTAB with no crackles, wheezes, or rhonchi GI: Normal bowel sounds present. Soft, Nontender, Nondistended. MSK: no edema, cyanosis, or clubbing noted Skin: warm, dry Neuro: CN2-12 grossly intact. Strength 5/5 in upper and lower extremities. Reflexes symmetric and intact bilaterally.  Psych: Normal affect and thought content     Gustavo Meditz M. Jerline Pain, MD 10/24/2020 2:21 PM

## 2020-10-25 LAB — COMPREHENSIVE METABOLIC PANEL
AG Ratio: 2 (calc) (ref 1.0–2.5)
ALT: 18 U/L (ref 9–46)
AST: 16 U/L (ref 10–35)
Albumin: 4.4 g/dL (ref 3.6–5.1)
Alkaline phosphatase (APISO): 51 U/L (ref 35–144)
BUN: 17 mg/dL (ref 7–25)
CO2: 29 mmol/L (ref 20–32)
Calcium: 9.7 mg/dL (ref 8.6–10.3)
Chloride: 103 mmol/L (ref 98–110)
Creat: 1.1 mg/dL (ref 0.70–1.25)
Globulin: 2.2 g/dL (calc) (ref 1.9–3.7)
Glucose, Bld: 151 mg/dL — ABNORMAL HIGH (ref 65–99)
Potassium: 4.4 mmol/L (ref 3.5–5.3)
Sodium: 138 mmol/L (ref 135–146)
Total Bilirubin: 0.4 mg/dL (ref 0.2–1.2)
Total Protein: 6.6 g/dL (ref 6.1–8.1)

## 2020-10-25 LAB — LIPID PANEL
Cholesterol: 221 mg/dL — ABNORMAL HIGH (ref <200)
HDL: 43 mg/dL (ref >=40)
LDL Cholesterol (Calc): 144 mg/dL (calc) — ABNORMAL HIGH
Non-HDL Cholesterol (Calc): 178 mg/dL (calc) — ABNORMAL HIGH (ref <130)
Total CHOL/HDL Ratio: 5.1 (calc) — ABNORMAL HIGH (ref <5.0)
Triglycerides: 206 mg/dL — ABNORMAL HIGH (ref <150)

## 2020-10-25 LAB — CBC
HCT: 43.7 % (ref 38.5–50.0)
Hemoglobin: 15 g/dL (ref 13.2–17.1)
MCH: 30.7 pg (ref 27.0–33.0)
MCHC: 34.3 g/dL (ref 32.0–36.0)
MCV: 89.5 fL (ref 80.0–100.0)
MPV: 10.1 fL (ref 7.5–12.5)
Platelets: 190 10*3/uL (ref 140–400)
RBC: 4.88 10*6/uL (ref 4.20–5.80)
RDW: 13 % (ref 11.0–15.0)
WBC: 4.9 10*3/uL (ref 3.8–10.8)

## 2020-10-25 LAB — TSH: TSH: 2.18 mIU/L (ref 0.40–4.50)

## 2020-10-25 LAB — PSA: PSA: 0.47 ng/mL (ref <=4.0)

## 2020-10-25 LAB — HEMOGLOBIN A1C
Hgb A1c MFr Bld: 6.9 % of total Hgb — ABNORMAL HIGH (ref <5.7)
Mean Plasma Glucose: 151 (calc)
eAG (mmol/L): 8.4 (calc)

## 2020-10-26 NOTE — Progress Notes (Signed)
Please inform patient of the following:  A1c is in the diabetic range. Recommend starting Metformin 750 mg daily and we can recheck in 3 months.  Cholesterol also elevated.  Recommend starting Lipitor 40 mg daily and we can recheck in year.He should continue working on diet and exercise.All his other  blood work is normal.

## 2022-08-17 ENCOUNTER — Ambulatory Visit (INDEPENDENT_AMBULATORY_CARE_PROVIDER_SITE_OTHER): Payer: BC Managed Care – PPO | Admitting: Family Medicine

## 2022-08-17 ENCOUNTER — Encounter: Payer: Self-pay | Admitting: Family Medicine

## 2022-08-17 VITALS — BP 150/88 | HR 55 | Temp 98.3°F | Ht 65.0 in | Wt 182.4 lb

## 2022-08-17 DIAGNOSIS — E785 Hyperlipidemia, unspecified: Secondary | ICD-10-CM

## 2022-08-17 DIAGNOSIS — Z0001 Encounter for general adult medical examination with abnormal findings: Secondary | ICD-10-CM

## 2022-08-17 DIAGNOSIS — R739 Hyperglycemia, unspecified: Secondary | ICD-10-CM | POA: Diagnosis not present

## 2022-08-17 DIAGNOSIS — Z125 Encounter for screening for malignant neoplasm of prostate: Secondary | ICD-10-CM

## 2022-08-17 LAB — CBC
HCT: 43.1 % (ref 39.0–52.0)
Hemoglobin: 14.8 g/dL (ref 13.0–17.0)
MCHC: 34.3 g/dL (ref 30.0–36.0)
MCV: 88.2 fl (ref 78.0–100.0)
Platelets: 197 10*3/uL (ref 150.0–400.0)
RBC: 4.89 Mil/uL (ref 4.22–5.81)
RDW: 13.3 % (ref 11.5–15.5)
WBC: 4.9 10*3/uL (ref 4.0–10.5)

## 2022-08-17 LAB — COMPREHENSIVE METABOLIC PANEL
ALT: 18 U/L (ref 0–53)
AST: 17 U/L (ref 0–37)
Albumin: 4.3 g/dL (ref 3.5–5.2)
Alkaline Phosphatase: 55 U/L (ref 39–117)
BUN: 15 mg/dL (ref 6–23)
CO2: 26 mEq/L (ref 19–32)
Calcium: 9.5 mg/dL (ref 8.4–10.5)
Chloride: 103 mEq/L (ref 96–112)
Creatinine, Ser: 1.03 mg/dL (ref 0.40–1.50)
GFR: 77.59 mL/min (ref >60.00)
Glucose, Bld: 177 mg/dL — ABNORMAL HIGH (ref 70–99)
Potassium: 4.4 mEq/L (ref 3.5–5.1)
Sodium: 137 mEq/L (ref 135–145)
Total Bilirubin: 0.7 mg/dL (ref 0.2–1.2)
Total Protein: 6.6 g/dL (ref 6.0–8.3)

## 2022-08-17 LAB — LIPID PANEL
Cholesterol: 209 mg/dL — ABNORMAL HIGH (ref 0–200)
HDL: 42.2 mg/dL (ref >39.00)
LDL Cholesterol: 128 mg/dL — ABNORMAL HIGH (ref 0–99)
NonHDL: 166.68
Total CHOL/HDL Ratio: 5
Triglycerides: 191 mg/dL — ABNORMAL HIGH (ref 0.0–149.0)
VLDL: 38.2 mg/dL (ref 0.0–40.0)

## 2022-08-17 LAB — PSA: PSA: 0.44 ng/mL (ref 0.10–4.00)

## 2022-08-17 LAB — TSH: TSH: 2.62 u[IU]/mL (ref 0.35–5.50)

## 2022-08-17 LAB — HEMOGLOBIN A1C: Hgb A1c MFr Bld: 7.9 % — ABNORMAL HIGH (ref 4.6–6.5)

## 2022-08-17 NOTE — Assessment & Plan Note (Signed)
Check labs 

## 2022-08-17 NOTE — Patient Instructions (Signed)
It was very nice to see you today!  Please keep up the good work with diet and exercise.  We will check blood work today.  Please keep an eye on your blood pressure and let me know if it is persistently elevated to 140/90 or higher.  We will see you back in year for your next physical.  Please come back sooner if needed.  Take care, Dr Jerline Pain  PLEASE NOTE:  If you had any lab tests please let us know if you have not heard back within a few days. You may see your results on mychart before we have a chance to review them but we will give you a call once they are reviewed by [place]. If we ordered any referrals today, please let us know if you have not heard from their office within the next week.   Please try these tips to maintain a healthy lifestyle:  Eat at least 3 REAL meals and 1-2 snacks per day.  Aim for no more than 5 hours between eating.  If you eat breakfast, please do so within one hour of getting up.   Each meal should contain half fruits/vegetables, one quarter protein, and one quarter carbs (no bigger than a computer mouse)  Cut down on sweet beverages. This includes juice, soda, and sweet tea.   Drink at least 1 glass of water with each meal and aim for at least 8 glasses per day  Exercise at least 150 minutes every week.    Preventive Care 13-59 Years Old, Male Preventive care refers to lifestyle choices and visits with your health care provider that can promote health and wellness. Preventive care visits are also called wellness exams. What can I expect for my preventive care visit? Counseling During your preventive care visit, your health care provider may ask about your: Medical history, including: Past medical problems. Family medical history. Current health, including: Emotional well-being. Home life and relationship well-being. Sexual activity. Lifestyle, including: Alcohol, nicotine or tobacco, and drug use. Access to firearms. Diet, exercise, and sleep  habits. Safety issues such as seatbelt and bike helmet use. Sunscreen use. Work and work Statistician. Physical exam Your health care provider will check your: Height and weight. These may be used to calculate your BMI (body mass index). BMI is a measurement that tells if you are at a healthy weight. Waist circumference. This measures the distance around your waistline. This measurement also tells if you are at a healthy weight and may help predict your risk of certain diseases, such as type 2 diabetes and high blood pressure. Heart rate and blood pressure. Body temperature. Skin for abnormal spots. What immunizations do I need?  Vaccines are usually given at various ages, according to a schedule. Your health care provider will recommend vaccines for you based on your age, medical history, and lifestyle or other factors, such as travel or where you work. What tests do I need? Screening Your health care provider may recommend screening tests for certain conditions. This may include: Lipid and cholesterol levels. Diabetes screening. This is done by checking your blood sugar (glucose) after you have not eaten for a while (fasting). Hepatitis B test. Hepatitis C test. HIV (human immunodeficiency virus) test. STI (sexually transmitted infection) testing, if you are at risk. Lung cancer screening. Prostate cancer screening. Colorectal cancer screening. Talk with your health care provider about your test results, treatment options, and if necessary, the need for more tests. Follow these instructions at home: Eating and drinking  Eat a diet that includes fresh fruits and vegetables, whole grains, lean protein, and low-fat dairy products. Take vitamin and mineral supplements as recommended by your health care provider. Do not drink alcohol if your health care provider tells you not to drink. If you drink alcohol: Limit how much you have to 0-2 drinks a day. Know how much alcohol is in your  drink. In the [place] one drink equals one 12 oz bottle of beer (355 mL), one 5 oz glass of wine (148 mL), or one 1 oz glass of hard liquor (44 mL). Lifestyle Brush your teeth every morning and night with fluoride toothpaste. Floss one time each day. Exercise for at least 30 minutes 5 or more days each week. Do not use any products that contain nicotine or tobacco. These products include cigarettes, chewing tobacco, and vaping devices, such as e-cigarettes. If you need help quitting, ask your health care provider. Do not use drugs. If you are sexually active, practice safe sex. Use a condom or other form of protection to prevent STIs. Take aspirin only as told by your health care provider. Make sure that you understand how much to take and what form to take. Work with your health care provider to find out whether it is safe and beneficial for you to take aspirin daily. Find healthy ways to manage stress, such as: Meditation, yoga, or listening to music. Journaling. Talking to a trusted person. Spending time with friends and family. Minimize exposure to UV radiation to reduce your risk of skin cancer. Safety Always wear your seat belt while driving or riding in a vehicle. Do not drive: If you have been drinking alcohol. Do not ride with someone who has been drinking. When you are tired or distracted. While texting. If you have been using any mind-altering substances or drugs. Wear a helmet and other protective equipment during sports activities. If you have firearms in your house, make sure you follow all gun safety procedures. What's next? Go to your health care provider once a year for an annual wellness visit. Ask your health care provider how often you should have your eyes and teeth checked. Stay up to date on all vaccines. This information is not intended to replace advice given to you by your health care provider. Make sure you discuss any questions you have with your health care  provider. Document Revised: 06/14/2021 Document Reviewed: 06/14/2021 Elsevier Patient Education  [employer].

## 2022-08-17 NOTE — Progress Notes (Signed)
Chief Complaint:  Jacob Caldwell is a 61 y.o. male who presents today for his annual comprehensive physical exam.    Assessment/Plan:  New/Acute Problems: Elevated Blood Pressure Reading Elevated today to 150s over 80s.  No symptoms.  Typically well controlled.  May have whitecoat hypertension.  We discussed home monitoring goal 140/90 or lower.  He will continue to monitor at home for the next few weeks and let us know if persistently elevated.  Check labs today.  Chronic Problems Addressed Today: Dyslipidemia Check labs.   Hyperglycemia Check labs.   Preventative Healthcare: Check labs. UTD on colon cancer screening. He will get shingles vaccine soon.   Patient Counseling(The following topics were reviewed and/or handout was given):  -Nutrition: Stressed importance of moderation in sodium/caffeine intake, saturated fat and cholesterol, caloric balance, sufficient intake of fresh fruits, vegetables, and fiber.  -Stressed the importance of regular exercise.   -Substance Abuse: Discussed cessation/primary prevention of tobacco, alcohol, or other drug use; driving or other dangerous activities under the influence; availability of treatment for abuse.   -Injury prevention: Discussed safety belts, safety helmets, smoke detector, smoking near bedding or upholstery.   -Sexuality: Discussed sexually transmitted diseases, partner selection, use of condoms, avoidance of unintended pregnancy and contraceptive alternatives.   -Dental health: Discussed importance of regular tooth brushing, flossing, and dental visits.  -Health maintenance and immunizations reviewed. Please refer to Health maintenance section.  Return to care in 1 year for next preventative visit.     Subjective:  HPI:  He has no acute complaints today.   Lifestyle Diet: Balanced. Plenty of fruits and vegetables.  Exercise: Plays tennis 3-4 times per week. Does a lot of walking.      08/17/2022    8:04 AM  Depression  screen PHQ 2/9  Decreased Interest 0  Down, Depressed, Hopeless 0  PHQ - 2 Score 0    Health Maintenance Due  Topic Date Due   HIV Screening  Never done   COVID-19 Vaccine (5 - Pfizer risk series) 11/07/2021   INFLUENZA VACCINE  07/31/2022     ROS: Per HPI, otherwise a complete review of systems was negative.   PMH:  The following were reviewed and entered/updated in epic: Past Medical History:  Diagnosis Date   Arthritis    Headache    Patient Active Problem List   Diagnosis Date Noted   Hyperglycemia 10/24/2020   Dyslipidemia 10/24/2020   Arthritis    History of colonic polyps 07/10/2016   Past Surgical History:  Procedure Laterality Date   EYE SURGERY     VASECTOMY  2003    Family History  Problem Relation Age of Onset   Memory loss Mother    Diabetes Maternal Grandfather    Colon cancer Neg Hx     Medications- reviewed and updated Current Outpatient Medications  Medication Sig Dispense Refill   ibuprofen (ADVIL) 400 MG tablet Take 400 mg by mouth every 6 (six) hours as needed.     Multiple Vitamin (MULTIVITAMIN) tablet Take 1 tablet by mouth daily.     No current facility-administered medications for this visit.    Allergies-reviewed and updated Allergies  Allergen Reactions   Diazepam     REACTION: hyperactive   Penicillins     REACTION: hives    Social History   Socioeconomic History   Marital status: Married    Spouse name: Not on file   Number of children: Not on file   Years of education: Not on file  Highest education level: Not on file  Occupational History   Not on file  Tobacco Use   Smoking status: Never   Smokeless tobacco: Never  Substance and Sexual Activity   Alcohol use: Yes    Alcohol/week: 4.0 standard drinks of alcohol    Types: 4 Standard drinks or equivalent per week    Comment: combo of wine, beer, liquor   Drug use: No   Sexual activity: Not on file  Other Topics Concern   Not on file  Social History  Narrative   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 Wt Readings from Last 3 Encounters:  08/17/22 182 lb 6.4 oz (82.7 kg)  10/24/20 185 lb 6.4 oz (84.1 kg)  09/08/19 182 lb 4 oz (82.7 kg)   Gen: NAD, resting comfortably HEENT: TMs normal bilaterally. OP clear. No thyromegaly noted.  CV: RRR with no murmurs appreciated Pulm: NWOB, CTAB with no crackles, wheezes, or rhonchi GI: Normal bowel sounds present. Soft, Nontender, Nondistended. MSK: no edema, cyanosis, or clubbing noted Skin: warm, dry Neuro: CN2-12 grossly intact. Strength 5/5 in upper and lower extremities. Reflexes symmetric and intact bilaterally.  Psych: Normal affect and thought content     Aubrea Meixner M. Jerline Pain, MD 08/17/2022 8:28 AM

## 2022-08-21 ENCOUNTER — Other Ambulatory Visit: Payer: Self-pay | Admitting: *Deleted

## 2022-08-21 MED ORDER — METFORMIN HCL 500 MG PO TABS: 500.0000 mg | ORAL_TABLET | Freq: Every day | ORAL | 3 refills | Status: DC

## 2022-08-21 NOTE — Progress Notes (Signed)
Please inform patient of the following:  Blood sugar is elevated into the diabetic range.  Recommend he start metformin 500 mg daily.  He should continue to work on diet and exercise as well.  Please send a new prescription for metformin if he is agreeable.  We should see him back in 3 months to recheck A1c.  His cholesterol levels are elevated but stable.  Do not need to start meds at this point but he should continue to work on diet and exercise as above.

## 2022-09-24 ENCOUNTER — Encounter: Payer: Self-pay | Admitting: *Deleted

## 2022-11-17 ENCOUNTER — Other Ambulatory Visit: Payer: Self-pay | Admitting: Family Medicine

## 2022-11-19 ENCOUNTER — Ambulatory Visit: Payer: BC Managed Care – PPO | Admitting: Family Medicine

## 2022-11-19 ENCOUNTER — Encounter: Payer: Self-pay | Admitting: Family Medicine

## 2022-11-19 VITALS — BP 154/91 | HR 66 | Temp 97.7°F | Ht 65.0 in | Wt 183.0 lb

## 2022-11-19 DIAGNOSIS — E785 Hyperlipidemia, unspecified: Secondary | ICD-10-CM

## 2022-11-19 DIAGNOSIS — R739 Hyperglycemia, unspecified: Secondary | ICD-10-CM | POA: Diagnosis not present

## 2022-11-19 LAB — POCT GLYCOSYLATED HEMOGLOBIN (HGB A1C): Hemoglobin A1C: 6.8 % — AB (ref 4.0–5.6)

## 2022-11-19 NOTE — Assessment & Plan Note (Signed)
A1c improved to 6.8.  He is tolerating metformin well.  We will continue current dose 500 mg daily.  He will come back next year for annual physical and we will recheck A1c at that time.  Discussed lifestyle modifications.

## 2022-11-19 NOTE — Assessment & Plan Note (Signed)
LDL 128 last year.  He will work on lifestyle modifications.  We can recheck again in about a year when he comes back in for CPE.

## 2022-11-19 NOTE — Patient Instructions (Signed)
It was very nice to see you today!  Continue with your current medications.  Please continue to work on diet and exercise.  Keep an eye on your blood pressure at home and let us know if it is persistently elevated.  We will see you back next year for your annual physical.  Come back sooner if needed.  Take care, Dr Jerline Pain  PLEASE NOTE:  If you had any lab tests please let us know if you have not heard back within a few days. You may see your results on mychart before we have a chance to review them but we will give you a call once they are reviewed by [place]. If we ordered any referrals today, please let us know if you have not heard from their office within the next week.   Please try these tips to maintain a healthy lifestyle:  Eat at least 3 REAL meals and 1-2 snacks per day.  Aim for no more than 5 hours between eating.  If you eat breakfast, please do so within one hour of getting up.   Each meal should contain half fruits/vegetables, one quarter protein, and one quarter carbs (no bigger than a computer mouse)  Cut down on sweet beverages. This includes juice, soda, and sweet tea.   Drink at least 1 glass of water with each meal and aim for at least 8 glasses per day  Exercise at least 150 minutes every week.

## 2022-11-19 NOTE — Progress Notes (Signed)
   Jacob Caldwell is a 62 y.o. male who presents today for an office visit.  Assessment/Plan:  New/Acute Problems: Elevated blood pressure reading Mildly elevated today.  May have whitecoat hypertension.  He will monitor at home and let us know if persistently elevated.  Chronic Problems Addressed Today: Hyperglycemia A1c improved to 6.8.  He is tolerating metformin well.  We will continue current dose 500 mg daily.  He will come back next year for annual physical and we will recheck A1c at that time.  Discussed lifestyle modifications.  Dyslipidemia LDL 128 last year.  He will work on lifestyle modifications.  We can recheck again in about a year when he comes back in for CPE.     Subjective:  HPI:  See A/P for status of chronic conditions.  Patient is here today to recheck A1c.  We saw him for his physical 3 months ago.  Found to have elevated A1c 7.9.  We started him on metformin 500 mg daily.  He has been tolerating well.  No significant side effects.  Trying to cut down on carbs and sugar.       Objective:  Physical Exam: BP (!) 154/91   Pulse 66   Temp 97.7 F (36.5 C) (Temporal)   Ht '5\' 5"'$  (1.651 m)   Wt 183 lb (83 kg)   SpO2 98%   BMI 30.45 kg/m   Gen: No acute distress, resting comfortably CV: Regular rate and rhythm with no murmurs appreciated Pulm: Normal work of breathing, clear to auscultation bilaterally with no crackles, wheezes, or rhonchi Neuro: Grossly normal, moves all extremities Psych: Normal affect and thought content      Ryin Ambrosius M. Jerline Pain, MD 11/19/2022 1:23 PM

## 2023-07-31 ENCOUNTER — Encounter (INDEPENDENT_AMBULATORY_CARE_PROVIDER_SITE_OTHER): Payer: Self-pay

## 2023-08-22 ENCOUNTER — Ambulatory Visit (INDEPENDENT_AMBULATORY_CARE_PROVIDER_SITE_OTHER): Payer: BC Managed Care – PPO | Admitting: Family Medicine

## 2023-08-22 ENCOUNTER — Encounter: Payer: Self-pay | Admitting: Family Medicine

## 2023-08-22 VITALS — BP 138/90 | HR 54 | Temp 97.7°F | Ht 65.0 in | Wt 181.0 lb

## 2023-08-22 DIAGNOSIS — E785 Hyperlipidemia, unspecified: Secondary | ICD-10-CM | POA: Diagnosis not present

## 2023-08-22 DIAGNOSIS — R739 Hyperglycemia, unspecified: Secondary | ICD-10-CM

## 2023-08-22 DIAGNOSIS — Z0001 Encounter for general adult medical examination with abnormal findings: Secondary | ICD-10-CM | POA: Diagnosis not present

## 2023-08-22 LAB — LIPID PANEL
Cholesterol: 224 mg/dL — ABNORMAL HIGH (ref 0–200)
HDL: 40.8 mg/dL (ref >39.00)
LDL Cholesterol: 157 mg/dL — ABNORMAL HIGH (ref 0–99)
NonHDL: 183.32
Total CHOL/HDL Ratio: 5
Triglycerides: 133 mg/dL (ref 0.0–149.0)
VLDL: 26.6 mg/dL (ref 0.0–40.0)

## 2023-08-22 LAB — HEMOGLOBIN A1C: Hgb A1c MFr Bld: 7.8 % — ABNORMAL HIGH (ref 4.6–6.5)

## 2023-08-22 LAB — CBC
HCT: 43.9 % (ref 39.0–52.0)
Hemoglobin: 14.6 g/dL (ref 13.0–17.0)
MCHC: 33.2 g/dL (ref 30.0–36.0)
MCV: 89.2 fl (ref 78.0–100.0)
Platelets: 212 10*3/uL (ref 150.0–400.0)
RBC: 4.92 Mil/uL (ref 4.22–5.81)
RDW: 13.5 % (ref 11.5–15.5)
WBC: 5.4 10*3/uL (ref 4.0–10.5)

## 2023-08-22 LAB — COMPREHENSIVE METABOLIC PANEL
ALT: 20 U/L (ref 0–53)
AST: 20 U/L (ref 0–37)
Albumin: 4.2 g/dL (ref 3.5–5.2)
Alkaline Phosphatase: 52 U/L (ref 39–117)
BUN: 18 mg/dL (ref 6–23)
CO2: 27 meq/L (ref 19–32)
Calcium: 9.6 mg/dL (ref 8.4–10.5)
Chloride: 103 meq/L (ref 96–112)
Creatinine, Ser: 1.03 mg/dL (ref 0.40–1.50)
GFR: 77.04 mL/min (ref >60.00)
Glucose, Bld: 193 mg/dL — ABNORMAL HIGH (ref 70–99)
Potassium: 4.3 meq/L (ref 3.5–5.1)
Sodium: 136 meq/L (ref 135–145)
Total Bilirubin: 0.6 mg/dL (ref 0.2–1.2)
Total Protein: 6.8 g/dL (ref 6.0–8.3)

## 2023-08-22 NOTE — Assessment & Plan Note (Signed)
Check lipids.  He is working on lifestyle modifications. 

## 2023-08-22 NOTE — Patient Instructions (Addendum)
It was very nice to see you today!  We will check blood work today.   You are due for your colonoscopy in 2027.  Please continue to work on diet and exercise.  Return in about 1 year (around 08/21/2024) for Annual Physical.   Take care, Dr Jimmey Ralph  PLEASE NOTE:  If you had any lab tests, please let us know if you have not heard back within a few days. You may see your results on mychart before we have a chance to review them but we will give you a call once they are reviewed by [place].   If we ordered any referrals today, please let us know if you have not heard from their office within the next week.   If you had any urgent prescriptions sent in today, please check with the pharmacy within an hour of our visit to make sure the prescription was transmitted appropriately.   Please try these tips to maintain a healthy lifestyle:  Eat at least 3 REAL meals and 1-2 snacks per day.  Aim for no more than 5 hours between eating.  If you eat breakfast, please do so within one hour of getting up.   Each meal should contain half fruits/vegetables, one quarter protein, and one quarter carbs (no bigger than a computer mouse)  Cut down on sweet beverages. This includes juice, soda, and sweet tea.   Drink at least 1 glass of water with each meal and aim for at least 8 glasses per day  Exercise at least 150 minutes every week.    Preventive Care 8-76 Years Old, Male Preventive care refers to lifestyle choices and visits with your health care provider that can promote health and wellness. Preventive care visits are also called wellness exams. What can I expect for my preventive care visit? Counseling During your preventive care visit, your health care provider may ask about your: Medical history, including: Past medical problems. Family medical history. Current health, including: Emotional well-being. Home life and relationship well-being. Sexual activity. Lifestyle, including: Alcohol,  nicotine or tobacco, and drug use. Access to firearms. Diet, exercise, and sleep habits. Safety issues such as seatbelt and bike helmet use. Sunscreen use. Work and work Astronomer. Physical exam Your health care provider will check your: Height and weight. These may be used to calculate your BMI (body mass index). BMI is a measurement that tells if you are at a healthy weight. Waist circumference. This measures the distance around your waistline. This measurement also tells if you are at a healthy weight and may help predict your risk of certain diseases, such as type 2 diabetes and high blood pressure. Heart rate and blood pressure. Body temperature. Skin for abnormal spots. What immunizations do I need?  Vaccines are usually given at various ages, according to a schedule. Your health care provider will recommend vaccines for you based on your age, medical history, and lifestyle or other factors, such as travel or where you work. What tests do I need? Screening Your health care provider may recommend screening tests for certain conditions. This may include: Lipid and cholesterol levels. Diabetes screening. This is done by checking your blood sugar (glucose) after you have not eaten for a while (fasting). Hepatitis B test. Hepatitis C test. HIV (human immunodeficiency virus) test. STI (sexually transmitted infection) testing, if you are at risk. Lung cancer screening. Prostate cancer screening. Colorectal cancer screening. Talk with your health care provider about your test results, treatment options, and if necessary, the need  for more tests. Follow these instructions at home: Eating and drinking  Eat a diet that includes fresh fruits and vegetables, whole grains, lean protein, and low-fat dairy products. Take vitamin and mineral supplements as recommended by your health care provider. Do not drink alcohol if your health care provider tells you not to drink. If you drink  alcohol: Limit how much you have to 0-2 drinks a day. Know how much alcohol is in your drink. In the [place] one drink equals one 12 oz bottle of beer (355 mL), one 5 oz glass of wine (148 mL), or one 1 oz glass of hard liquor (44 mL). Lifestyle Brush your teeth every morning and night with fluoride toothpaste. Floss one time each day. Exercise for at least 30 minutes 5 or more days each week. Do not use any products that contain nicotine or tobacco. These products include cigarettes, chewing tobacco, and vaping devices, such as e-cigarettes. If you need help quitting, ask your health care provider. Do not use drugs. If you are sexually active, practice safe sex. Use a condom or other form of protection to prevent STIs. Take aspirin only as told by your health care provider. Make sure that you understand how much to take and what form to take. Work with your health care provider to find out whether it is safe and beneficial for you to take aspirin daily. Find healthy ways to manage stress, such as: Meditation, yoga, or listening to music. Journaling. Talking to a trusted person. Spending time with friends and family. Minimize exposure to UV radiation to reduce your risk of skin cancer. Safety Always wear your seat belt while driving or riding in a vehicle. Do not drive: If you have been drinking alcohol. Do not ride with someone who has been drinking. When you are tired or distracted. While texting. If you have been using any mind-altering substances or drugs. Wear a helmet and other protective equipment during sports activities. If you have firearms in your house, make sure you follow all gun safety procedures. What's next? Go to your health care provider once a year for an annual wellness visit. Ask your health care provider how often you should have your eyes and teeth checked. Stay up to date on all vaccines. This information is not intended to replace advice given to you by your  health care provider. Make sure you discuss any questions you have with your health care provider. Document Revised: 06/14/2021 Document Reviewed: 06/14/2021 Elsevier Patient Education  2024 [employer].

## 2023-08-22 NOTE — Progress Notes (Signed)
Chief Complaint:  Jacob Caldwell is a 62 y.o. male who presents today for his annual comprehensive physical exam.    Assessment/Plan:  Chronic Problems Addressed Today: Hyperglycemia On metformin 500 mg daily.  Tolerating well.  Recheck A1c today.  Dyslipidemia Check lipids.  He is working on lifestyle modifications.  Preventative Healthcare: Check labs.  He will get shingles vaccine at the pharmacy.  He will get COVID and flu vaccine later this season.  Due for colonoscopy in 2027.   Patient Counseling(The following topics were reviewed and/or handout was given):  -Nutrition: Stressed importance of moderation in sodium/caffeine intake, saturated fat and cholesterol, caloric balance, sufficient intake of fresh fruits, vegetables, and fiber.  -Stressed the importance of regular exercise.   -Substance Abuse: Discussed cessation/primary prevention of tobacco, alcohol, or other drug use; driving or other dangerous activities under the influence; availability of treatment for abuse.   -Injury prevention: Discussed safety belts, safety helmets, smoke detector, smoking near bedding or upholstery.   -Sexuality: Discussed sexually transmitted diseases, partner selection, use of condoms, avoidance of unintended pregnancy and contraceptive alternatives.   -Dental health: Discussed importance of regular tooth brushing, flossing, and dental visits.  -Health maintenance and immunizations reviewed. Please refer to Health maintenance section.  Return to care in 1 year for next preventative visit.     Subjective:  HPI:  He has no acute complaints today.   Lifestyle Diet: Balanced. Trying to get plenty of fruits. Trying to cut down on sweets and carbohydrates.  Exercise: Plays tennis routinely.      08/22/2023    8:06 AM  Depression screen PHQ 2/9  Decreased Interest 0  Down, Depressed, Hopeless 0  PHQ - 2 Score 0   ROS: Per HPI, otherwise a complete review of systems was negative.    PMH:  The following were reviewed and entered/updated in epic: Past Medical History:  Diagnosis Date   Arthritis    Headache    Patient Active Problem List   Diagnosis Date Noted   Hyperglycemia 10/24/2020   Dyslipidemia 10/24/2020   Arthritis    History of colonic polyps 07/10/2016   Past Surgical History:  Procedure Laterality Date   EYE SURGERY     VASECTOMY  2003    Family History  Problem Relation Age of Onset   Memory loss Mother    Diabetes Maternal Grandfather    Colon cancer Neg Hx     Medications- reviewed and updated Current Outpatient Medications  Medication Sig Dispense Refill   ibuprofen (ADVIL) 400 MG tablet Take 400 mg by mouth every 6 (six) hours as needed.     metFORMIN (GLUCOPHAGE) 500 MG tablet TAKE 1 TABLET BY MOUTH EVERY DAY WITH BREAKFAST 180 tablet 3   Multiple Vitamin (MULTIVITAMIN) tablet Take 1 tablet by mouth daily.     No current facility-administered medications for this visit.    Allergies-reviewed and updated Allergies  Allergen Reactions   Diazepam     REACTION: hyperactive   Penicillins     REACTION: hives    Social History   Socioeconomic History   Marital status: Married    Spouse name: Not on file   Number of children: Not on file   Years of education: Not on file   Highest education level: Not on file  Occupational History   Not on file  Tobacco Use   Smoking status: Never   Smokeless tobacco: Never  Substance and Sexual Activity   Alcohol use: Yes    Alcohol/week:  4.0 standard drinks of alcohol    Types: 4 Standard drinks or equivalent per week    Comment: combo of wine, beer, liquor   Drug use: No   Sexual activity: Not on file  Other Topics Concern   Not on file  Social History Narrative   Married   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 Wt Readings from Last 3 Encounters:  08/22/23 181 lb (82.1 kg)  11/19/22 183 lb (83 kg)  08/17/22 182 lb 6.4 oz (82.7 kg)   Gen: NAD, resting comfortably HEENT: TMs normal bilaterally. OP clear. No thyromegaly noted.  CV: RRR with no murmurs appreciated Pulm: NWOB, CTAB with no crackles, wheezes, or rhonchi GI: Normal bowel sounds present. Soft, Nontender, Nondistended. MSK: no edema, cyanosis, or clubbing noted Skin: warm, dry Neuro: CN2-12 grossly intact. Strength 5/5 in upper and lower extremities. Reflexes symmetric and intact bilaterally.  Psych: Normal affect and thought content     Beth Goodlin M. Jimmey Ralph, MD 08/22/2023 8:32 AM

## 2023-08-22 NOTE — Assessment & Plan Note (Signed)
On metformin 500 mg daily.  Tolerating well.  Recheck A1c today.

## 2023-08-23 LAB — TSH: TSH: 1.78 u[IU]/mL (ref 0.35–5.50)

## 2023-08-27 NOTE — Progress Notes (Signed)
A1c is above goal.  We need to increase his metformin to 500 mg twice daily.  Please send a new prescription.  I would like for him to come back to recheck this in 3-6 months.  His cholesterol levels elevated.  Recommend starting Lipitor 40 mg daily to improve numbers and lower risk heart attack or stroke.  Please send in if he is agreeable to start this.  Regardless, he should continue to work on diet and exercise and we can recheck this in a year or so.  The rest of his labs are all stable and we can recheck in year.

## 2023-08-28 ENCOUNTER — Other Ambulatory Visit: Payer: Self-pay | Admitting: *Deleted

## 2023-08-28 MED ORDER — METFORMIN HCL 500 MG PO TABS: 500.0000 mg | ORAL_TABLET | Freq: Two times a day (BID) | ORAL | 1 refills | Status: DC

## 2024-02-19 ENCOUNTER — Other Ambulatory Visit: Payer: Self-pay | Admitting: Family Medicine

## 2024-02-24 ENCOUNTER — Ambulatory Visit: Payer: 59 | Admitting: Family Medicine

## 2024-02-24 ENCOUNTER — Encounter: Payer: Self-pay | Admitting: Family Medicine

## 2024-02-24 VITALS — BP 148/97 | HR 62 | Temp 98.1°F | Ht 65.0 in | Wt 179.8 lb

## 2024-02-24 DIAGNOSIS — E785 Hyperlipidemia, unspecified: Secondary | ICD-10-CM

## 2024-02-24 DIAGNOSIS — R739 Hyperglycemia, unspecified: Secondary | ICD-10-CM | POA: Diagnosis not present

## 2024-02-24 DIAGNOSIS — R030 Elevated blood-pressure reading, without diagnosis of hypertension: Secondary | ICD-10-CM | POA: Insufficient documentation

## 2024-02-24 LAB — POCT GLYCOSYLATED HEMOGLOBIN (HGB A1C): Hemoglobin A1C: 6.8 % — AB (ref 4.0–5.6)

## 2024-02-24 NOTE — Assessment & Plan Note (Signed)
 Elevated today.  Home readings have typically been at goal.  He will monitor at home and let us know if persistently elevated.  Recheck in 6 months.  If still elevated will need to start low-dose antihypertensive such as amlodipine.  We did discuss lifestyle modifications including importance of regular exercise and low-sodium diet.

## 2024-02-24 NOTE — Patient Instructions (Addendum)
 It was very nice to see you today!  Your A1c today is 6.8. We can continue the metformin for now. Please let me know if you would like to start [facility].   We will check labs next time you come here.  Please continue to work on your diet and exercise.  Monitor your blood pressure at home and let us know if it is persistently elevated.  No follow-ups on file.   Take care, Dr Jimmey Ralph  PLEASE NOTE:  If you had any lab tests, please let us know if you have not heard back within a few days. You may see your results on mychart before we have a chance to review them but we will give you a call once they are reviewed by [place].   If we ordered any referrals today, please let us know if you have not heard from their office within the next week.   If you had any urgent prescriptions sent in today, please check with the pharmacy within an hour of our visit to make sure the prescription was transmitted appropriately.   Please try these tips to maintain a healthy lifestyle:  Eat at least 3 REAL meals and 1-2 snacks per day.  Aim for no more than 5 hours between eating.  If you eat breakfast, please do so within one hour of getting up.   Each meal should contain half fruits/vegetables, one quarter protein, and one quarter carbs (no bigger than a computer mouse)  Cut down on sweet beverages. This includes juice, soda, and sweet tea.   Drink at least 1 glass of water with each meal and aim for at least 8 glasses per day  Exercise at least 150 minutes every week.

## 2024-02-24 NOTE — Assessment & Plan Note (Signed)
 He is working on lifestyle modifications.  Declined starting statins.  Will recheck in 6 months at CPE.

## 2024-02-24 NOTE — Assessment & Plan Note (Signed)
 A1c 6.8.  He is on metformin 500 mg twice daily.  He is having some mild GI side effects with this.  We did discuss switching to a GLP-1 agonist however he would like to hold off on this for now.  He will let us know if he changes mind.  We can switch to Mounjaro 2.5 mg weekly at some point the future if he is interested.

## 2024-02-24 NOTE — Progress Notes (Signed)
   Jacob Caldwell is a 63 y.o. male who presents today for an office visit.  Assessment/Plan:  Chronic Problems Addressed Today: Hyperglycemia A1c 6.8.  He is on metformin 500 mg twice daily.  He is having some mild GI side effects with this.  We did discuss switching to a GLP-1 agonist however he would like to hold off on this for now.  He will let us know if he changes mind.  We can switch to Mounjaro 2.5 mg weekly at some point the future if he is interested.   Dyslipidemia He is working on lifestyle modifications.  Declined starting statins.  Will recheck in 6 months at CPE.  Elevated blood pressure reading Elevated today.  Home readings have typically been at goal.  He will monitor at home and let us know if persistently elevated.  Recheck in 6 months.  If still elevated will need to start low-dose antihypertensive such as amlodipine.  We did discuss lifestyle modifications including importance of regular exercise and low-sodium diet.     Subjective:  HPI:  See Assessment / plan for status of chronic conditions.  Patient is here today for 17-month follow-up.  At his last visit, his A1c was 7.8.  We recommended increasing his metformin to 500 mg twice daily.  Also recommended starting Lipitor 40 mg daily. He has been working diet and exercise. He has cut out all fried foods. Cutting down on red meat. Playing a lot of tennis.        Objective:  Physical Exam: BP (!) 148/97   Pulse 62   Temp 98.1 F (36.7 C) (Temporal)   Ht 5\' 5"  (1.651 m)   Wt 179 lb 12.8 oz (81.6 kg)   SpO2 97%   BMI 29.92 kg/m   Gen: No acute distress, resting comfortably CV: Regular rate and rhythm with no murmurs appreciated Pulm: Normal work of breathing, clear to auscultation bilaterally with no crackles, wheezes, or rhonchi Neuro: Grossly normal, moves all extremities Psych: Normal affect and thought content      Jacob Vitug M. Jimmey Ralph, MD 02/24/2024 10:18 AM

## 2024-02-25 ENCOUNTER — Encounter: Payer: Self-pay | Admitting: Family Medicine

## 2024-02-26 ENCOUNTER — Other Ambulatory Visit: Payer: Self-pay | Admitting: *Deleted

## 2024-02-26 MED ORDER — METFORMIN HCL 500 MG PO TABS: 500.0000 mg | ORAL_TABLET | Freq: Two times a day (BID) | ORAL | 1 refills | Status: DC

## 2024-02-26 NOTE — Telephone Encounter (Signed)
 Rx resend

## 2024-08-24 ENCOUNTER — Encounter: Payer: Self-pay | Admitting: Family Medicine

## 2024-08-24 ENCOUNTER — Ambulatory Visit (INDEPENDENT_AMBULATORY_CARE_PROVIDER_SITE_OTHER): Payer: 59 | Admitting: Family Medicine

## 2024-08-24 VITALS — BP 158/98 | HR 60 | Temp 97.9°F | Ht 65.0 in | Wt 176.2 lb

## 2024-08-24 DIAGNOSIS — Z125 Encounter for screening for malignant neoplasm of prostate: Secondary | ICD-10-CM | POA: Diagnosis not present

## 2024-08-24 DIAGNOSIS — R030 Elevated blood-pressure reading, without diagnosis of hypertension: Secondary | ICD-10-CM

## 2024-08-24 DIAGNOSIS — Z0001 Encounter for general adult medical examination with abnormal findings: Secondary | ICD-10-CM

## 2024-08-24 DIAGNOSIS — E785 Hyperlipidemia, unspecified: Secondary | ICD-10-CM | POA: Diagnosis not present

## 2024-08-24 DIAGNOSIS — R739 Hyperglycemia, unspecified: Secondary | ICD-10-CM

## 2024-08-24 LAB — LIPID PANEL
Cholesterol: 210 mg/dL — ABNORMAL HIGH (ref 0–200)
HDL: 40.2 mg/dL (ref >39.00)
LDL Cholesterol: 138 mg/dL — ABNORMAL HIGH (ref 0–99)
NonHDL: 170
Total CHOL/HDL Ratio: 5
Triglycerides: 158 mg/dL — ABNORMAL HIGH (ref 0.0–149.0)
VLDL: 31.6 mg/dL (ref 0.0–40.0)

## 2024-08-24 LAB — COMPREHENSIVE METABOLIC PANEL WITH GFR
ALT: 19 U/L (ref 0–53)
AST: 19 U/L (ref 0–37)
Albumin: 4.5 g/dL (ref 3.5–5.2)
Alkaline Phosphatase: 49 U/L (ref 39–117)
BUN: 17 mg/dL (ref 6–23)
CO2: 27 meq/L (ref 19–32)
Calcium: 9.8 mg/dL (ref 8.4–10.5)
Chloride: 102 meq/L (ref 96–112)
Creatinine, Ser: 1.01 mg/dL (ref 0.40–1.50)
GFR: 78.32 mL/min (ref >60.00)
Glucose, Bld: 104 mg/dL — ABNORMAL HIGH (ref 70–99)
Potassium: 4.7 meq/L (ref 3.5–5.1)
Sodium: 139 meq/L (ref 135–145)
Total Bilirubin: 0.5 mg/dL (ref 0.2–1.2)
Total Protein: 6.9 g/dL (ref 6.0–8.3)

## 2024-08-24 LAB — CBC
HCT: 42.3 % (ref 39.0–52.0)
Hemoglobin: 14.3 g/dL (ref 13.0–17.0)
MCHC: 33.9 g/dL (ref 30.0–36.0)
MCV: 87.9 fl (ref 78.0–100.0)
Platelets: 212 K/uL (ref 150.0–400.0)
RBC: 4.81 Mil/uL (ref 4.22–5.81)
RDW: 13.7 % (ref 11.5–15.5)
WBC: 5.6 K/uL (ref 4.0–10.5)

## 2024-08-24 LAB — POCT GLYCOSYLATED HEMOGLOBIN (HGB A1C): Hemoglobin A1C: 6.6 % — AB (ref 4.0–5.6)

## 2024-08-24 LAB — PSA: PSA: 0.6 ng/mL (ref 0.10–4.00)

## 2024-08-24 LAB — TSH: TSH: 2 u[IU]/mL (ref 0.35–5.50)

## 2024-08-24 NOTE — Assessment & Plan Note (Signed)
 A1c 6.6.  On metformin  500 mg twice daily.  Having some mild side effects with these are tolerable.  We did discuss switching to GLP agonist however he would like to hold off on this for now.  Recheck in 6 to 12 months.  Discussed lifestyle modifications.

## 2024-08-24 NOTE — Assessment & Plan Note (Signed)
 Elevated today though her readings have been at goal.  Likely has whitecoat hypertension.  He will continue to monitor at home and follow-up with us  if persistently elevated.

## 2024-08-24 NOTE — Assessment & Plan Note (Signed)
 Check lipids. Discussed lifestyle modifications.

## 2024-08-24 NOTE — Progress Notes (Signed)
 Chief Complaint:  Jacob Caldwell is a 63 y.o. male who presents today for his annual comprehensive physical exam.    Assessment/Plan:  Chronic Problems Addressed Today: Hyperglycemia A1c 6.6.  On metformin  500 mg twice daily.  Having some mild side effects with these are tolerable.  We did discuss switching to GLP agonist however he would like to hold off on this for now.  Recheck in 6 to 12 months.  Discussed lifestyle modifications.  Dyslipidemia Check lipids.  Discussed lifestyle modifications.  Elevated blood pressure reading Elevated today though her readings have been at goal.  Likely has whitecoat hypertension.  He will continue to monitor at home and follow-up with us  if persistently elevated.  Preventative Healthcare: Check labs.  Due for colonoscopy in 2027.  Discussed shingles and pneumonia vaccine however he would like to hold off on this for now.  Patient Counseling(The following topics were reviewed and/or handout was given):  -Nutrition: Stressed importance of moderation in sodium/caffeine intake, saturated fat and cholesterol, caloric balance, sufficient intake of fresh fruits, vegetables, and fiber.  -Stressed the importance of regular exercise.   -Substance Abuse: Discussed cessation/primary prevention of tobacco, alcohol, or other drug use; driving or other dangerous activities under the influence; availability of treatment for abuse.   -Injury prevention: Discussed safety belts, safety helmets, smoke detector, smoking near bedding or upholstery.   -Sexuality: Discussed sexually transmitted diseases, partner selection, use of condoms, avoidance of unintended pregnancy and contraceptive alternatives.   -Dental health: Discussed importance of regular tooth brushing, flossing, and dental visits.  -Health maintenance and immunizations reviewed. Please refer to Health maintenance section.  Return to care in 1 year for next preventative visit.     Subjective:   HPI:  He has no acute complaints today. Patient is here today for his annual physical.  See assessment / plan for status of chronic conditions.  Discussed the use of AI scribe software for clinical note transcription with the patient, who gave verbal consent to proceed.  History of Present Illness Jacob Caldwell is a 63 year old male who presents for an annual physical exam.  He has been monitoring his blood pressure at home, which has been normal, but experiences elevated readings during office visits. His wife assists him in monitoring his readings at home using a calibrated machine.  He is currently taking metformin  and experiences mild stomach upset as a side effect. He prefers to continue with metformin  due to previous adverse reactions to other medications. His A1c is 6.6, and he is actively managing his diet to reduce sugar and starchy carbohydrates, having lost about five pounds over the past year.  He received the flu and COVID vaccines last year and plans to do so again this September. He has not had COVID and only experienced a minor cold in the past year.  He maintains an active lifestyle, playing tennis about four times a week. He is mindful of his diet, focusing on reducing sugar intake and increasing vegetable consumption. He enjoys fruits, particularly berries and bananas, and is aware of the sugar content in certain fruits like grapes and mangoes.     Lifestyle Diet: Cutting down on sugar and starchy carbohydrates.  Exercise: Playing tennis regularly.      02/24/2024    9:30 AM  Depression screen PHQ 2/9  Decreased Interest 0  Down, Depressed, Hopeless 0  PHQ - 2 Score 0    Health Maintenance Due  Topic Date Due   Zoster Vaccines- Shingrix (  1 of 2) Never done   Pneumococcal Vaccine: 50+ Years (1 of 1 - PCV) Never done   COVID-19 Vaccine (7 - 2024-25 season) 09/01/2023     ROS: Per HPI, otherwise a complete review of systems was negative.    PMH:  The following were reviewed and entered/updated in epic: Past Medical History:  Diagnosis Date   Arthritis    Headache    Patient Active Problem List   Diagnosis Date Noted   Elevated blood pressure reading 02/24/2024   Hyperglycemia 10/24/2020   Dyslipidemia 10/24/2020   Arthritis    History of colonic polyps 07/10/2016   Past Surgical History:  Procedure Laterality Date   EYE SURGERY     VASECTOMY  2003    Family History  Problem Relation Age of Onset   Memory loss Mother    Pulmonary fibrosis Father    Diabetes Maternal Grandfather    Pulmonary fibrosis Paternal Uncle    Colon cancer Neg Hx     Medications- reviewed and updated Current Outpatient Medications  Medication Sig Dispense Refill   ibuprofen (ADVIL) 400 MG tablet Take 400 mg by mouth every 6 (six) hours as needed.     metFORMIN  (GLUCOPHAGE ) 500 MG tablet Take 1 tablet (500 mg total) by mouth 2 (two) times daily with a meal. 180 tablet 1   Multiple Vitamin (MULTIVITAMIN) tablet Take 1 tablet by mouth daily.     No current facility-administered medications for this visit.    Allergies-reviewed and updated Allergies  Allergen Reactions   Diazepam     REACTION: hyperactive   Penicillins     REACTION: hives    Social History   Socioeconomic History   Marital status: Married    Spouse name: Not on file   Number of children: Not on file   Years of education: Not on file   Highest education level: Doctorate  Occupational History   Not on file  Tobacco Use   Smoking status: Never   Smokeless tobacco: Never  Substance and Sexual Activity   Alcohol use: Yes    Alcohol/week: 4.0 standard drinks of alcohol    Types: 4 Standard drinks or equivalent per week    Comment: combo of wine, beer, liquor   Drug use: No   Sexual activity: Not on file  Other Topics Concern   Not on file  Social History Narrative   Married   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[employer] Strain: Low Risk  (08/20/2024)   Overall Financial Resource Strain (CARDIA)    Difficulty of Paying Living Expenses: Not hard at all  Food Insecurity: No Food Insecurity (08/20/2024)   Hunger Vital Sign    Worried About Running Out of Food in the Last Year: Never true    Ran Out of Food in the Last Year: Never true  Transportation Needs: No Transportation Needs (08/20/2024)   PRAPARE - Administrator, Civil Service (Medical): No    Lack of Transportation (Non-Medical): No  Physical Activity: Sufficiently Active (08/20/2024)   Exercise Vital Sign    Days of Exercise per Week: 4 days    Minutes of Exercise per Session: 90 min  Stress: No Stress Concern Present (08/20/2024)   [employer] of Occupational Health - Occupational Stress Questionnaire    Feeling of Stress: Not at all  Social Connections: Moderately Integrated (08/20/2024)   Social Connection and Isolation Panel    Frequency of Communication with Friends  and Family: Twice a week    Frequency of Social Gatherings with Friends and Family: Three times a week    Attends Religious Services: Never    Active Member of Clubs or Organizations: Yes    Attends Engineer, structural: More than 4 times per year    Marital Status: Married        Objective:  Physical Exam: BP (!) 158/98   Pulse 60   Temp 97.9 F (36.6 C) (Oral)   Ht 5' 5 (1.651 m)   Wt 176 lb 3.2 oz (79.9 kg)   SpO2 99%   BMI 29.32 kg/m   Body mass index is 29.32 kg/m. Wt Readings from Last 3 Encounters:  08/24/24 176 lb 3.2 oz (79.9 kg)  02/24/24 179 lb 12.8 oz (81.6 kg)  08/22/23 181 lb (82.1 kg)   Gen: NAD, resting comfortably HEENT: TMs normal bilaterally. OP clear. No thyromegaly noted.  CV: RRR with no murmurs appreciated Pulm: NWOB, CTAB with no crackles, wheezes, or rhonchi GI: Normal bowel sounds present. Soft, Nontender, Nondistended. MSK: no edema, cyanosis, or clubbing noted Skin: warm, dry Neuro: CN2-12 grossly  intact. Strength 5/5 in upper and lower extremities. Reflexes symmetric and intact bilaterally.  Psych: Normal affect and thought content     Alfonso Shackett M. Kennyth, MD 08/24/2024 10:09 AM

## 2024-08-24 NOTE — Patient Instructions (Addendum)
 It was very nice to see you today!  VISIT SUMMARY: Today, you had your annual physical exam. We discussed your blood pressure, blood sugar levels, and overall wellness. You are maintaining an active lifestyle and a healthy diet, which is great to see.  YOUR PLAN: ELEVATED BLOOD PRESSURE READING WITHOUT HYPERTENSION: Your blood pressure was elevated during the visit, likely due to white coat hypertension, as your home readings are normal. -Recheck your blood pressure before leaving the office. -Continue monitoring your blood pressure at home a few times a month using your calibrated machine. -Maintain your current lifestyle and dietary habits to support blood pressure control.  HYPERGLYCEMIA: Your blood sugar levels are being managed with Metformin , and your A1c is 6.6. -Continue taking Metformin  500 mg twice daily. -We will obtain blood work today, including A1c. -Continue your dietary modifications to reduce sugar and starchy carbs. -Monitor your weight and aim for further weight loss if needed.  GENERAL HEALTH MAINTENANCE: We discussed your overall wellness and preventive measures. -Administer flu and COVID-19 vaccines in September. -Consider discussing the pneumonia vaccine if you are interested. -Continue regular physical activity, including playing tennis four times a week. -Maintain a healthy diet with reduced sugar and starchy carbs. -Monitor your weight and aim for further weight loss if needed.  Return in about 1 year (around 08/24/2025).   Take care, Dr Kennyth  PLEASE NOTE:  If you had any lab tests, please let us  know if you have not heard back within a few days. You may see your results on mychart before we have a chance to review them but we will give you a call once they are reviewed by us .   If we ordered any referrals today, please let us  know if you have not heard from their office within the next week.   If you had any urgent prescriptions sent in today, please  check with the pharmacy within an hour of our visit to make sure the prescription was transmitted appropriately.   Please try these tips to maintain a healthy lifestyle:  Eat at least 3 REAL meals and 1-2 snacks per day.  Aim for no more than 5 hours between eating.  If you eat breakfast, please do so within one hour of getting up.   Each meal should contain half fruits/vegetables, one quarter protein, and one quarter carbs (no bigger than a computer mouse)  Cut down on sweet beverages. This includes juice, soda, and sweet tea.   Drink at least 1 glass of water with each meal and aim for at least 8 glasses per day  Exercise at least 150 minutes every week.    Preventive Care 69 Years and Older, Male Preventive care refers to lifestyle choices and visits with your health care provider that can promote health and wellness. Preventive care visits are also called wellness exams. What can I expect for my preventive care visit? Counseling During your preventive care visit, your health care provider may ask about your: Medical history, including: Past medical problems. Family medical history. History of falls. Current health, including: Emotional well-being. Home life and relationship well-being. Sexual activity. Memory and ability to understand (cognition). Lifestyle, including: Alcohol, nicotine or tobacco, and drug use. Access to firearms. Diet, exercise, and sleep habits. Work and work Astronomer. Sunscreen use. Safety issues such as seatbelt and bike helmet use. Physical exam Your health care provider will check your: Height and weight. These may be used to calculate your BMI (body mass index). BMI is a  measurement that tells if you are at a healthy weight. Waist circumference. This measures the distance around your waistline. This measurement also tells if you are at a healthy weight and may help predict your risk of certain diseases, such as type 2 diabetes and high blood  pressure. Heart rate and blood pressure. Body temperature. Skin for abnormal spots. What immunizations do I need?  Vaccines are usually given at various ages, according to a schedule. Your health care provider will recommend vaccines for you based on your age, medical history, and lifestyle or other factors, such as travel or where you work. What tests do I need? Screening Your health care provider may recommend screening tests for certain conditions. This may include: Lipid and cholesterol levels. Diabetes screening. This is done by checking your blood sugar (glucose) after you have not eaten for a while (fasting). Hepatitis C test. Hepatitis B test. HIV (human immunodeficiency virus) test. STI (sexually transmitted infection) testing, if you are at risk. Lung cancer screening. Colorectal cancer screening. Prostate cancer screening. Abdominal aortic aneurysm (AAA) screening. You may need this if you are a current or former smoker. Talk with your health care provider about your test results, treatment options, and if necessary, the need for more tests. Follow these instructions at home: Eating and drinking  Eat a diet that includes fresh fruits and vegetables, whole grains, lean protein, and low-fat dairy products. Limit your intake of foods with high amounts of sugar, saturated fats, and salt. Take vitamin and mineral supplements as recommended by your health care provider. Do not drink alcohol if your health care provider tells you not to drink. If you drink alcohol: Limit how much you have to 0-2 drinks a day. Know how much alcohol is in your drink. In the [place] one drink equals one 12 oz bottle of beer (355 mL), one 5 oz glass of wine (148 mL), or one 1 oz glass of hard liquor (44 mL). Lifestyle Brush your teeth every morning and night with fluoride toothpaste. Floss one time each day. Exercise for at least 30 minutes 5 or more days each week. Do not use any products that  contain nicotine or tobacco. These products include cigarettes, chewing tobacco, and vaping devices, such as e-cigarettes. If you need help quitting, ask your health care provider. Do not use drugs. If you are sexually active, practice safe sex. Use a condom or other form of protection to prevent STIs. Take aspirin only as told by your health care provider. Make sure that you understand how much to take and what form to take. Work with your health care provider to find out whether it is safe and beneficial for you to take aspirin daily. Ask your health care provider if you need to take a cholesterol-lowering medicine (statin). Find healthy ways to manage stress, such as: Meditation, yoga, or listening to music. Journaling. Talking to a trusted person. Spending time with friends and family. Safety Always wear your seat belt while driving or riding in a vehicle. Do not drive: If you have been drinking alcohol. Do not ride with someone who has been drinking. When you are tired or distracted. While texting. If you have been using any mind-altering substances or drugs. Wear a helmet and other protective equipment during sports activities. If you have firearms in your house, make sure you follow all gun safety procedures. Minimize exposure to UV radiation to reduce your risk of skin cancer. What's next? Visit your health care provider once a year  for an annual wellness visit. Ask your health care provider how often you should have your eyes and teeth checked. Stay up to date on all vaccines. This information is not intended to replace advice given to you by your health care provider. Make sure you discuss any questions you have with your health care provider. Document Revised: 06/14/2021 Document Reviewed: 06/14/2021 Elsevier Patient Education  2024 [employer].

## 2024-08-25 ENCOUNTER — Ambulatory Visit: Payer: Self-pay | Admitting: Family Medicine

## 2024-08-25 NOTE — Progress Notes (Signed)
 His cholesterol is mildly elevated.  He may benefit from starting a statin to improve his numbers and low risk heart attack and stroke.  Please send in Lipitor 40 mg daily if he is agreeable to start.  The rest of his labs are all at goal.  Regardless, he should continue to work on diet and exercise and we can recheck again in 6 to 12 months.

## 2024-09-07 ENCOUNTER — Ambulatory Visit: Admitting: Sports Medicine

## 2024-09-07 ENCOUNTER — Encounter: Payer: Self-pay | Admitting: Sports Medicine

## 2024-09-07 VITALS — BP 150/102 | Ht 65.0 in | Wt 176.0 lb

## 2024-09-07 DIAGNOSIS — M1712 Unilateral primary osteoarthritis, left knee: Secondary | ICD-10-CM

## 2024-09-07 MED ORDER — MELOXICAM 15 MG PO TABS: ORAL_TABLET | ORAL | 0 refills | Status: AC

## 2024-09-07 NOTE — Progress Notes (Signed)
   Subjective:    Patient ID: Jacob Caldwell, male    DOB: Aug 26, 1961, 63 y.o.   MRN: [id number]  HPI chief complaint: Left knee pain  [name] is a very pleasant 63 year old male that presents today with left knee pain.  His pain is minimal but his main issue is with tightness and pressure from swelling.  This is worse with playing tennis.  He has been icing and using a compression sleeve.  Also taking over-the-counter NSAIDs.  He has had problems with both knees in the past.  He was a patient of mine at [facility] over a decade ago.  I injected both his right and left knees at that time and he got good results.  He also did very well with meloxicam .  No recent injuries.    Review of Systems As above    Objective:   Physical Exam  Well-developed, well-nourished.  No acute distress  Left knee: Range of motion is 0 to 90 degrees.  Trace to 1+ effusion.  Slight tenderness to palpation along the medial joint line.  Negative McMurray's.  Good joint stability.      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  An anterior lateral approach is utilized.  He tolerates this without difficulty.  Will start meloxicam  15 mg daily for 5 days then as needed.  Continue with his compression sleeve and postexercise icing.  If symptoms persist consider updated x-rays at that time.  Follow-up for ongoing or recalcitrant issues.  Consent obtained and verified. Time-out conducted. Noted no overlying erythema, induration, or other signs of local infection. Skin prepped in a sterile fashion. Topical analgesic spray: Ethyl chloride. Joint: Left knee Needle: 25-gauge 1.5 inch Completed without difficulty. Meds: 3 cc 1% Xylocaine, 1 cc (40 mg) Depo-Medrol  This note was dictated using Dragon naturally speaking software and may contain errors in syntax, spelling, or content which have not been identified prior to  signing this note.

## 2024-11-03 ENCOUNTER — Other Ambulatory Visit: Payer: Self-pay | Admitting: Sports Medicine

## 2024-11-20 ENCOUNTER — Other Ambulatory Visit: Payer: Self-pay | Admitting: Family Medicine

## 2025-08-26 ENCOUNTER — Encounter: Admitting: Family Medicine

## 2025-08-27 ENCOUNTER — Encounter: Admitting: Family Medicine

## 2025-09-14 ENCOUNTER — Encounter: Admitting: Family Medicine
